# Patient Record
Sex: Female | Born: 2000 | Hispanic: Yes | Marital: Single | Smoking: Never smoker
Health system: Southern US, Community
[De-identification: ages and names within clinical notes are randomized; demographics above are authoritative.]

## PROBLEM LIST (undated history)

## (undated) ENCOUNTER — Emergency Department: Payer: MEDICAID

## (undated) DIAGNOSIS — G43909 Migraine, unspecified, not intractable, without status migrainosus: Secondary | ICD-10-CM

## (undated) DIAGNOSIS — R569 Unspecified convulsions: Secondary | ICD-10-CM

---
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
## 2022-02-27 ENCOUNTER — Other Ambulatory Visit: Payer: Self-pay

## 2022-02-27 ENCOUNTER — Encounter: Payer: Self-pay | Admitting: Emergency Medicine

## 2022-02-27 ENCOUNTER — Emergency Department: Payer: No Typology Code available for payment source

## 2022-02-27 ENCOUNTER — Emergency Department
Admission: EM | Admit: 2022-02-27 | Discharge: 2022-02-27 | Disposition: A | Discharge status: Home / Self Care | Payer: No Typology Code available for payment source | Attending: Emergency Medicine | Admitting: Emergency Medicine

## 2022-02-27 DIAGNOSIS — F419 Anxiety disorder, unspecified: Secondary | ICD-10-CM | POA: Insufficient documentation

## 2022-02-27 DIAGNOSIS — R002 Palpitations: Secondary | ICD-10-CM | POA: Diagnosis not present

## 2022-02-27 DIAGNOSIS — R0602 Shortness of breath: Secondary | ICD-10-CM | POA: Insufficient documentation

## 2022-02-27 DIAGNOSIS — F411 Generalized anxiety disorder: Secondary | ICD-10-CM

## 2022-02-27 DIAGNOSIS — R0789 Other chest pain: Secondary | ICD-10-CM | POA: Diagnosis not present

## 2022-02-27 LAB — CBC WITH DIFFERENTIAL/PLATELET
Abs Immature Granulocytes: 0.03 10*3/uL (ref 0.00–0.07)
Basophils Absolute: 0 10*3/uL (ref 0.0–0.1)
Basophils Relative: 0 %
Eosinophils Absolute: 0 10*3/uL (ref 0.0–0.5)
Eosinophils Relative: 0 %
HCT: 43.3 % (ref 36.0–46.0)
Hemoglobin: 14.5 g/dL (ref 12.0–15.0)
Immature Granulocytes: 0 %
Lymphocytes Relative: 20 %
Lymphs Abs: 2.1 10*3/uL (ref 0.7–4.0)
MCH: 29.5 pg (ref 26.0–34.0)
MCHC: 33.5 g/dL (ref 30.0–36.0)
MCV: 88 fL (ref 80.0–100.0)
Monocytes Absolute: 0.6 10*3/uL (ref 0.1–1.0)
Monocytes Relative: 6 %
Neutro Abs: 7.7 10*3/uL (ref 1.7–7.7)
Neutrophils Relative %: 74 %
Platelets: 287 10*3/uL (ref 150–400)
RBC: 4.92 MIL/uL (ref 3.87–5.11)
RDW: 11.9 % (ref 11.5–15.5)
WBC: 10.4 10*3/uL (ref 4.0–10.5)
nRBC: 0 % (ref 0.0–0.2)

## 2022-02-27 LAB — ETHANOL: Alcohol, Ethyl (B): <10 mg/dL (ref <10)

## 2022-02-27 LAB — URINE DRUG SCREEN, QUALITATIVE (ARMC ONLY)
Amphetamines, Ur Screen: NOT DETECTED
Barbiturates, Ur Screen: NOT DETECTED
Benzodiazepine, Ur Scrn: NOT DETECTED
Cannabinoid 50 Ng, Ur ~~LOC~~: POSITIVE — AB
Cocaine Metabolite,Ur ~~LOC~~: NOT DETECTED
MDMA (Ecstasy)Ur Screen: NOT DETECTED
Methadone Scn, Ur: NOT DETECTED
Opiate, Ur Screen: NOT DETECTED
Phencyclidine (PCP) Ur S: NOT DETECTED
Tricyclic, Ur Screen: NOT DETECTED

## 2022-02-27 LAB — URINALYSIS, ROUTINE W REFLEX MICROSCOPIC
Bilirubin Urine: NEGATIVE
Glucose, UA: NEGATIVE mg/dL
Hgb urine dipstick: NEGATIVE
Ketones, ur: 20 mg/dL — AB
Nitrite: NEGATIVE
Protein, ur: NEGATIVE mg/dL
Specific Gravity, Urine: 1.018 (ref 1.005–1.030)
pH: 8 (ref 5.0–8.0)

## 2022-02-27 LAB — COMPREHENSIVE METABOLIC PANEL
ALT: 19 U/L (ref 0–44)
AST: 24 U/L (ref 15–41)
Albumin: 4.8 g/dL (ref 3.5–5.0)
Alkaline Phosphatase: 68 U/L (ref 38–126)
Anion gap: 8 (ref 5–15)
BUN: 9 mg/dL (ref 6–20)
CO2: 24 mmol/L (ref 22–32)
Calcium: 9.4 mg/dL (ref 8.9–10.3)
Chloride: 106 mmol/L (ref 98–111)
Creatinine, Ser: 0.57 mg/dL (ref 0.44–1.00)
GFR, Estimated: >60 mL/min (ref >60)
Glucose, Bld: 87 mg/dL (ref 70–99)
Potassium: 3.4 mmol/L — ABNORMAL LOW (ref 3.5–5.1)
Sodium: 138 mmol/L (ref 135–145)
Total Bilirubin: 1 mg/dL (ref 0.3–1.2)
Total Protein: 8.6 g/dL — ABNORMAL HIGH (ref 6.5–8.1)

## 2022-02-27 LAB — TSH: TSH: 1.093 u[IU]/mL (ref 0.350–4.500)

## 2022-02-27 LAB — D-DIMER, QUANTITATIVE: D-Dimer, Quant: 0.46 ug/mL-FEU (ref 0.00–0.50)

## 2022-02-27 LAB — LACTIC ACID, PLASMA: Lactic Acid, Venous: 1 mmol/L (ref 0.5–1.9)

## 2022-02-27 LAB — SALICYLATE LEVEL: Salicylate Lvl: <7 mg/dL — ABNORMAL LOW (ref 7.0–30.0)

## 2022-02-27 LAB — ACETAMINOPHEN LEVEL: Acetaminophen (Tylenol), Serum: <10 ug/mL — ABNORMAL LOW (ref 10–30)

## 2022-02-27 LAB — TROPONIN I (HIGH SENSITIVITY): Troponin I (High Sensitivity): <2 ng/L (ref <18)

## 2022-02-27 LAB — PREGNANCY, URINE: Preg Test, Ur: NEGATIVE

## 2022-02-27 MED ORDER — ESCITALOPRAM OXALATE 5 MG PO TABS: 5.0000 mg | ORAL_TABLET | Freq: Every day | ORAL | 0 refills | Status: AC

## 2022-02-27 MED ORDER — ESCITALOPRAM OXALATE 10 MG PO TABS
5.0000 mg | ORAL_TABLET | Freq: Every day | ORAL | Status: DC
  Administered 2022-02-27: 5 mg via ORAL
  Filled 2022-02-27: qty 1

## 2022-02-27 MED ORDER — LORAZEPAM 0.5 MG PO TABS
0.5000 mg | ORAL_TABLET | Freq: Once | ORAL | Status: AC
  Administered 2022-02-27: 0.5 mg via ORAL
  Filled 2022-02-27: qty 1

## 2022-02-27 MED ORDER — HYDROXYZINE HCL 10 MG PO TABS: 10.0000 mg | ORAL_TABLET | Freq: Three times a day (TID) | ORAL | 0 refills | Status: AC | PRN

## 2022-02-27 MED ORDER — HYDROXYZINE HCL 10 MG PO TABS: 10.0000 mg | ORAL_TABLET | Freq: Three times a day (TID) | ORAL | Status: DC | PRN

## 2022-02-27 NOTE — Discharge Instructions (Signed)
Please follow-up with the providers on the list you were given.  Get your prescriptions filled.  You should be out of get them today.  Start them today.  Do not hesitate to return if you feel worse or feels the need to return.  If you ever feel like you want to hurt yourself or anyone else please return immediately.

## 2022-02-27 NOTE — ED Triage Notes (Signed)
Pt to ED via POV, pt states that she has been having increased anxiety over the past few months but that it has been worse the last few days. Pt is not sure if something triggered her anxiety, pt states that she had a cat pass away but other than that nothing major has happened. Pt is tearful in triage.

## 2022-02-27 NOTE — ED Provider Notes (Signed)
[facility] Provider Note    Event Date/Time   First MD Initiated Contact with Patient 02/27/22 1522     (approximate)   History   Anxiety   HPI  Emily Lyons is a 21 y.o. female complains of worsening anxiety.  She says any little thing sets her off.  Her cat died recently and that has made her anxious.  She reports she gets episodes of palpitations with shortness of breath.  She can get nausea with it.  She has had this problem for a number of years but this is getting much worse.  She does not want to go home until we can do something about the anxiety.  She is not coughing.  She is not running a fever.      Physical Exam   Triage Vital Signs: ED Triage Vitals [02/27/22 1434]  Enc Vitals Group     BP (!) 147/97     Pulse Rate (!) 124     Resp 16     Temp 98.6 F (37 C)     Temp Source Oral     SpO2 96 %     Weight      Height      Head Circumference      Peak Flow      Pain Score 0     Pain Loc      Pain Edu?      Excl. in [place]?     Most recent vital signs: Vitals:   02/27/22 1434 02/27/22 1633  BP: (!) 147/97 110/73  Pulse: (!) 124 89  Resp: 16 18  Temp: 98.6 F (37 C) 98.4 F (36.9 C)  SpO2: 96% 100%     General: Awake, looks sad and upset CV:  Good peripheral perfusion.  Heart regular rate and rhythm no audible murmurs but tacky Resp:  Normal effort.  Lungs are clear Abd:  No distention.  Soft nontender no organomegaly palpable Extremities: No edema   ED Results / Procedures / Treatments   Labs (all labs ordered are listed, but only abnormal results are displayed) Labs Reviewed  COMPREHENSIVE METABOLIC PANEL - Abnormal; Notable for the following components:      Result Value   Potassium 3.4 (*)    Total Protein 8.6 (*)    All other components within normal limits  ACETAMINOPHEN LEVEL - Abnormal; Notable for the following components:   Acetaminophen (Tylenol), Serum <10 (*)    All other components within  normal limits  SALICYLATE LEVEL - Abnormal; Notable for the following components:   Salicylate Lvl <7.0 (*)    All other components within normal limits  URINALYSIS, ROUTINE W REFLEX MICROSCOPIC - Abnormal; Notable for the following components:   Color, Urine YELLOW (*)    APPearance HAZY (*)    Ketones, ur 20 (*)    Leukocytes,Ua SMALL (*)    Bacteria, UA RARE (*)    All other components within normal limits  URINE DRUG SCREEN, QUALITATIVE ([facility] ONLY) - Abnormal; Notable for the following components:   Cannabinoid 50 Ng, Ur [place] POSITIVE (*)    All other components within normal limits  ETHANOL  LACTIC ACID, PLASMA  CBC WITH DIFFERENTIAL/PLATELET  PREGNANCY, URINE  D-DIMER, QUANTITATIVE  LACTIC ACID, PLASMA  TSH  TROPONIN I (HIGH SENSITIVITY)  TROPONIN I (HIGH SENSITIVITY)     EKG  EKG read interpreted by me shows normal sinus rhythm rate of 83 rightward axis no acute ST-T wave changes  are seen.  Patient has no other EKGs.   RADIOLOGY Chest x-ray read by radiology reviewed and interpreted by me shows no acute disease  PROCEDURES:  Critical Care performed:   Procedures   MEDICATIONS ORDERED IN ED: Medications  escitalopram (LEXAPRO) tablet 5 mg (has no administration in time range)  hydrOXYzine (ATARAX) tablet 10 mg (has no administration in time range)  LORazepam (ATIVAN) tablet 0.5 mg (0.5 mg Oral Given 02/27/22 1719)     IMPRESSION / MDM / ASSESSMENT AND PLAN / ED COURSE  I reviewed the triage vital signs and the nursing notes. Patient with severe anxiety complains of intermittent chest tightness and shortness of breath.  She was very tachycardic.  EKG now shows her rightward axis with S1Q3T3.  I believe she most likely is just anxious however I will check a D-dimer.  She is young and healthy and her only other risk factor is implanted birth control pill in the arm.  ----------------------------------------- 5:29 PM on  02/27/2022 ----------------------------------------- Patient's labs are okay D-dimer is negative.  Psychiatry is seeing her has given her some Ativan now and she is feeling somewhat better already.  They have called in some prescriptions for her and follow-up as been arranged.  She is cleared by psych I will let her go now.  Patient's presentation is most consistent with acute complicated illness / injury requiring diagnostic workup.  Admission was considered for this patient but it developed and was not needed.     FINAL CLINICAL IMPRESSION(S) / ED DIAGNOSES   Final diagnoses:  Anxiety     Rx / DC Orders   ED Discharge Orders          Ordered    escitalopram (LEXAPRO) 5 MG tablet  Daily        02/27/22 1728    hydrOXYzine (ATARAX) 10 MG tablet  3 times daily PRN        02/27/22 1728             Note:  This document was prepared using Dragon voice recognition software and may include unintentional dictation errors.   Nena Polio, MD 02/27/22 [phone]

## 2022-02-27 NOTE — BH Assessment (Signed)
Comprehensive Clinical Assessment (CCA) Screening, Triage and Referral Note  02/27/2022 Emily Lyons [id number]  Chief Complaint:  Chief Complaint  Patient presents with   Anxiety   Visit Diagnosis: Anxiety Disorder  Emily Lyons is a 21 year old female who presents to the ER due to increase symptoms of anxiety and panic attacks. She states she has dealt with it for majority of her life and learned to cope with it. However, recently she been unable to manage it and affected in her quality of life. She reports of having been prescribe medications in the past but currently isn't taking anything. During the interview, she was cooperative and pleasant and appeared to be anxious. She denies SI/HI and AV/H.  Writer provided the patient with information and instructions on how to access Outpatient Mental Health Treatment.  ______________________________ [facility] [address] [place] [address] [phone]    Patient Reported Information How did you hear about [place]? No data recorded What Is the Reason for Your Visit/Call Today? Patient having increase anxiety and panic attacks.  How Long Has This Been Causing You Problems? <Week  What Do You Feel Would Help You the Most Today? Treatment for Depression or other mood problem   Have You Recently Had Any Thoughts About Hurting Yourself? No  Are You Planning to Commit Suicide/Harm Yourself At This time? No   Have you Recently Had Thoughts About Hurting Someone Emily Lyons? No  Are You Planning to Harm Someone at This Time? No  Explanation: No data recorded  Have You Used Any Alcohol or Drugs in the Past 24 Hours? No  How Long Ago Did You Use Drugs or Alcohol? No data recorded What Did You Use and How Much? No data recorded  Do You Currently Have a Therapist/Psychiatrist? No  Name of Therapist/Psychiatrist: No data recorded  Have You Been Recently Discharged From Any Office Practice or  Programs? No  Explanation of Discharge From Practice/Program: No data recorded   CCA Screening Triage Referral Assessment Type of Contact: Face-to-Face  Telemedicine Service Delivery:   Is this Initial or Reassessment? No data recorded Date Telepsych consult ordered in [facility]:  No data recorded Time Telepsych consult ordered in [facility]:  No data recorded Location of Assessment: [facility] ED  Provider Location: [facility]   Collateral Involvement: No data recorded  Does Patient Have a Court Appointed Legal Guardian? No data recorded Name and Contact of Legal Guardian: No data recorded If Minor and Not Living with Parent(s), Who has Custody? No data recorded Is CPS involved or ever been involved? Never  Is APS involved or ever been involved? Never   Patient Determined To Be At Risk for Harm To Self or Others Based on Review of Patient Reported Information or Presenting Complaint? No  Method: No data recorded Availability of Means: No data recorded Intent: No data recorded Notification Required: No data recorded Additional Information for Danger to Others Potential: No data recorded Additional Comments for Danger to Others Potential: No data recorded Are There Guns or Other Weapons in Your Home? No data recorded Types of Guns/Weapons: No data recorded Are These Weapons Safely Secured?                            No data recorded Who Could Verify You Are Able To Have These Secured: No data recorded Do You Have any Outstanding Charges, Pending Court Dates, Parole/Probation? No data recorded Contacted To Inform of  Risk of Harm To Self or Others: No data recorded  Does Patient Present under Involuntary Commitment? No  IVC Papers Initial File Date: No data recorded  [place] of Residence: [place]   Patient Currently Receiving the Following Services: Not Receiving Services   Determination of Need: Emergent (2 hours)   Options For Referral: ED Visit   Discharge Disposition:    Gunnar Fusi MS, LCAS, [facility], [facility] Therapeutic Triage Specialist 02/27/2022 5:40 PM

## 2022-02-27 NOTE — Consult Note (Signed)
[facility] Face-to-Face Psychiatry Consult   Reason for Consult:  anxiety, panic attacks Referring Physician:  EDP Patient Identification: Emily Lyons MRN:  [id number] Principal Diagnosis: Generalized anxiety disorder Diagnosis:  Principal Problem:   Generalized anxiety disorder   Total Time spent with patient: 45 minutes  Subjective:   Emily Lyons is a 21 y.o. female patient admitted with panic attack.  HPI:  21 yo female presented to the ED with a panic attack. Her anxiety has increased to the point she is not able to perform like she normally does.  She has been having frequent panic attacks, no apparent trigger.  No depression or suicidal/homicidal ideations, hallucinations, or substance abuse.  She is willing to follow up with outpatient services at [facility] in [place].  Discussed medications and will start hydroxyzine 10 mg TID PRN anxiety and 30 mg at bedtime PRN sleep as she had difficulty at times going to sleep related to the anxiety.  Appetite is fair, no weight changes.  Discussed Lexapro and side effects including suicidal ideations starting with the initiation and mania.  Psych cleared with resources to follow up outpatient.  Past Psychiatric History: anxiety  Risk to Self:  none Risk to Others:  none Prior Inpatient Therapy:  none Prior Outpatient Therapy:  none  Past Medical History: History reviewed. No pertinent past medical history. History reviewed. No pertinent surgical history. Family History: No family history on file. Family Psychiatric  History: none Social History:  Social History   Substance and Sexual Activity  Alcohol Use Never     Social History   Substance and Sexual Activity  Drug Use Yes   Types: Marijuana   Comment: week ago    Social History   Socioeconomic History   Marital status: Single    Spouse name: Not on file   Number of children: Not on file   Years of education: Not on file   Highest education level: Not on file   Occupational History   Not on file  Tobacco Use   Smoking status: Never   Smokeless tobacco: Never  Substance and Sexual Activity   Alcohol use: Never   Drug use: Yes    Types: Marijuana    Comment: week ago   Sexual activity: Yes    Birth control/protection: Pill  Other Topics Concern   Not on file  Social History Narrative   Not on file   Social Determinants of Health   Financial Resource Strain: Not on file  Food Insecurity: Not on file  Transportation Needs: Not on file  Physical Activity: Not on file  Stress: Not on file  Social Connections: Not on file   Additional Social History:    Allergies:  No Known Allergies  Labs:  Results for orders placed or performed during the hospital encounter of 02/27/22 (from the past 48 hour(s))  Comprehensive metabolic panel     Status: Abnormal   Collection Time: 02/27/22  3:58 PM  Result Value Ref Range   Sodium 138 135 - 145 mmol/L   Potassium 3.4 (L) 3.5 - 5.1 mmol/L   Chloride 106 98 - 111 mmol/L   CO2 24 22 - 32 mmol/L   Glucose, Bld 87 70 - 99 mg/dL    Comment: Glucose reference range applies only to samples taken after fasting for at least 8 hours.   BUN 9 6 - 20 mg/dL   Creatinine, Ser 0.62 0.44 - 1.00 mg/dL   Calcium 9.4 8.9 - 37.6 mg/dL   Total  Protein 8.6 (H) 6.5 - 8.1 g/dL   Albumin 4.8 3.5 - 5.0 g/dL   AST 24 15 - 41 U/L   ALT 19 0 - 44 U/L   Alkaline Phosphatase 68 38 - 126 U/L   Total Bilirubin 1.0 0.3 - 1.2 mg/dL   GFR, Estimated >60 >60 mL/min    Comment: (NOTE) Calculated using the CKD-EPI Creatinine Equation (2021)    Anion gap 8 5 - 15    Comment: Performed at [facility], [address] [place], [place] [address]  Acetaminophen level     Status: Abnormal   Collection Time: 02/27/22  3:58 PM  Result Value Ref Range   Acetaminophen (Tylenol), Serum <10 (L) 10 - 30 ug/mL    Comment: (NOTE) Therapeutic concentrations vary significantly. A range of 10-30 ug/mL  may be an effective  concentration for many patients. However, some  are best treated at concentrations outside of this range. Acetaminophen concentrations >150 ug/mL at 4 hours after ingestion  and >50 ug/mL at 12 hours after ingestion are often associated with  toxic reactions.  Performed at [facility], [address] [place], [place] [address]   Ethanol     Status: None   Collection Time: 02/27/22  3:58 PM  Result Value Ref Range   Alcohol, Ethyl (B) <10 <10 mg/dL    Comment: (NOTE) Lowest detectable limit for serum alcohol is 10 mg/dL.  For medical purposes only. Performed at [facility], [place], [place] [address]   Troponin I (High Sensitivity)     Status: None   Collection Time: 02/27/22  3:58 PM  Result Value Ref Range   Troponin I (High Sensitivity) <2 <18 ng/L    Comment: (NOTE) Elevated high sensitivity troponin I (hsTnI) values and significant  changes across serial measurements may suggest ACS but many other  chronic and acute conditions are known to elevate hsTnI results.  Refer to the "Links" section for chest pain algorithms and additional  guidance. Performed at [facility], [address] [place], [place] [address]   Salicylate level     Status: Abnormal   Collection Time: 02/27/22  3:58 PM  Result Value Ref Range   Salicylate Lvl <5.3 (L) 7.0 - 30.0 mg/dL    Comment: Performed at [facility], [address] [place], [place] [address]  CBC with Differential     Status: None   Collection Time: 02/27/22  3:58 PM  Result Value Ref Range   WBC 10.4 4.0 - 10.5 K/uL   RBC 4.92 3.87 - 5.11 MIL/uL   Hemoglobin 14.5 12.0 - 15.0 g/dL   HCT 43.3 36.0 - 46.0 %   MCV 88.0 80.0 - 100.0 fL   MCH 29.5 26.0 - 34.0 pg   MCHC 33.5 30.0 - 36.0 g/dL   RDW 11.9 11.5 - 15.5 %   Platelets 287 150 - 400 K/uL   nRBC 0.0 0.0 - 0.2 %   Neutrophils Relative % 74 %   Neutro Abs 7.7 1.7 - 7.7 K/uL   Lymphocytes Relative 20 %   Lymphs  Abs 2.1 0.7 - 4.0 K/uL   Monocytes Relative 6 %   Monocytes Absolute 0.6 0.1 - 1.0 K/uL   Eosinophils Relative 0 %   Eosinophils Absolute 0.0 0.0 - 0.5 K/uL   Basophils Relative 0 %   Basophils Absolute 0.0 0.0 - 0.1 K/uL   Immature Granulocytes 0 %   Abs Immature Granulocytes 0.03 0.00 -  0.07 K/uL    Comment: Performed at [facility], [address] [place], [place] [address]  Pregnancy, urine     Status: None   Collection Time: 02/27/22  3:58 PM  Result Value Ref Range   Preg Test, Ur NEGATIVE NEGATIVE    Comment: Performed at [facility], [address] [place], [place] [address]  Urinalysis, Routine w reflex microscopic     Status: Abnormal   Collection Time: 02/27/22  3:58 PM  Result Value Ref Range   Color, Urine YELLOW (A) YELLOW   APPearance HAZY (A) CLEAR   Specific Gravity, Urine 1.018 1.005 - 1.030   pH 8.0 5.0 - 8.0   Glucose, UA NEGATIVE NEGATIVE mg/dL   Hgb urine dipstick NEGATIVE NEGATIVE   Bilirubin Urine NEGATIVE NEGATIVE   Ketones, ur 20 (A) NEGATIVE mg/dL   Protein, ur NEGATIVE NEGATIVE mg/dL   Nitrite NEGATIVE NEGATIVE   Leukocytes,Ua SMALL (A) NEGATIVE   RBC / HPF 0-5 0 - 5 RBC/hpf   WBC, UA 0-5 0 - 5 WBC/hpf   Bacteria, UA RARE (A) NONE SEEN   Squamous Epithelial / LPF 0-5 0 - 5   Mucus PRESENT     Comment: Performed at [facility], [address] [place], [place] [address]  Urine Drug Screen, Qualitative     Status: Abnormal   Collection Time: 02/27/22  3:58 PM  Result Value Ref Range   Tricyclic, Ur Screen NONE DETECTED NONE DETECTED   Amphetamines, Ur Screen NONE DETECTED NONE DETECTED   MDMA (Ecstasy)Ur Screen NONE DETECTED NONE DETECTED   Cocaine Metabolite,Ur [place] NONE DETECTED NONE DETECTED   Opiate, Ur Screen NONE DETECTED NONE DETECTED   Phencyclidine (PCP) Ur S NONE DETECTED NONE DETECTED   Cannabinoid 50 Ng, Ur  POSITIVE (A) NONE DETECTED   Barbiturates, Ur Screen NONE DETECTED NONE DETECTED    Benzodiazepine, Ur Scrn NONE DETECTED NONE DETECTED   Methadone Scn, Ur NONE DETECTED NONE DETECTED    Comment: (NOTE) Tricyclics + metabolites, urine    Cutoff 1000 ng/mL Amphetamines + metabolites, urine  Cutoff 1000 ng/mL MDMA (Ecstasy), urine              Cutoff 500 ng/mL Cocaine Metabolite, urine          Cutoff 300 ng/mL Opiate + metabolites, urine        Cutoff 300 ng/mL Phencyclidine (PCP), urine         Cutoff 25 ng/mL Cannabinoid, urine                 Cutoff 50 ng/mL Barbiturates + metabolites, urine  Cutoff 200 ng/mL Benzodiazepine, urine              Cutoff 200 ng/mL Methadone, urine                   Cutoff 300 ng/mL  The urine drug screen provides only a preliminary, unconfirmed analytical test result and should not be used for non-medical purposes. Clinical consideration and professional judgment should be applied to any positive drug screen result due to possible interfering substances. A more specific alternate chemical method must be used in order to obtain a confirmed analytical result. Gas chromatography / mass spectrometry (GC/MS) is the preferred confirm atory method. Performed at [facility], [address] [place], [place] [address]   Lactic acid, plasma     Status: None   Collection Time: 02/27/22  4:37 PM  Result Value Ref Range  Lactic Acid, Venous 1.0 0.5 - 1.9 mmol/L    Comment: Performed at [facility], [address] [place], [place] [address]  D-dimer, quantitative     Status: None   Collection Time: 02/27/22  4:37 PM  Result Value Ref Range   D-Dimer, Quant 0.46 0.00 - 0.50 ug/mL-FEU    Comment: (NOTE) At the manufacturer cut-off value of 0.5 g/mL FEU, this assay has a negative predictive value of 95-100%.This assay is intended for use in conjunction with a clinical pretest probability (PTP) assessment model to exclude pulmonary embolism (PE) and deep venous thrombosis (DVT) in outpatients suspected of PE or  DVT. Results should be correlated with clinical presentation. Performed at [facility], [address] [place], [place] [address]     No current facility-administered medications for this encounter.   Current Outpatient Medications  Medication Sig Dispense Refill   cephALEXin (KEFLEX) 500 MG capsule Take 1 capsule (500 mg total) by mouth 3 (three) times daily. (Patient not taking: Reported on 12/26/2018) 30 capsule 0   HYDROcodone-acetaminophen (NORCO) 5-325 MG tablet Take 1 tablet by mouth every 6 (six) hours as needed for up to 7 doses for severe pain. (Patient not taking: Reported on 12/26/2018) 7 tablet 0   naproxen (NAPROSYN) 375 MG tablet Take 1 tablet (375 mg total) by mouth 2 (two) times daily with a meal. (Patient not taking: Reported on 12/26/2018) 10 tablet 0   norethindrone-ethinyl estradiol (LOESTRIN) 1-20 MG-MCG tablet Take 1 tablet by mouth daily.      ondansetron (ZOFRAN ODT) 4 MG disintegrating tablet Take 1 tablet (4 mg total) by mouth every 8 (eight) hours as needed for nausea or vomiting. (Patient not taking: Reported on 12/26/2018) 20 tablet 0   prochlorperazine (COMPAZINE) 10 MG tablet Take 1 tablet (10 mg total) by mouth every 6 (six) hours as needed for nausea or vomiting. (Patient not taking: Reported on 12/26/2018) 12 tablet 0   ranitidine (ZANTAC) 150 MG tablet Take 1 tablet (150 mg total) by mouth 2 (two) times daily. (Patient not taking: Reported on 07/12/2019) 60 tablet 1    Musculoskeletal: Strength & Muscle Tone: within normal limits Gait & Station: normal Patient leans: N/A  Psychiatric Specialty Exam: Physical Exam Vitals and nursing note reviewed.  Constitutional:      Appearance: Normal appearance.  HENT:     Head: Normocephalic.     Nose: Nose normal.  Pulmonary:     Effort: Pulmonary effort is normal.  Musculoskeletal:        General: Normal range of motion.     Cervical back: Normal range of motion.  Neurological:     General: No  focal deficit present.     Mental Status: She is alert and oriented to person, place, and time.  Psychiatric:        Attention and Perception: Attention and perception normal.        Mood and Affect: Mood is anxious.        Speech: Speech normal.        Behavior: Behavior normal. Behavior is cooperative.        Thought Content: Thought content normal.        Cognition and Memory: Cognition and memory normal.        Judgment: Judgment normal.     Review of Systems  Psychiatric/Behavioral:  The patient is nervous/anxious.   All other systems reviewed and are negative.   Blood pressure 121/83, pulse 91, temperature 98.4 F (36.9 C),  temperature source Oral, resp. rate 18, SpO2 100 %.There is no height or weight on file to calculate BMI.  General Appearance: Casual  Eye Contact:  Good  Speech:  Normal Rate  Volume:  Normal  Mood:  Anxious  Affect:  Congruent  Thought Process:  Coherent  Orientation:  Full (Time, Place, and Person)  Thought Content:  WDL and Logical  Suicidal Thoughts:  No  Homicidal Thoughts:  No  Memory:  Immediate;   Good Recent;   Good Remote;   Good  Judgement:  Good  Insight:  Good  Psychomotor Activity:  Normal  Concentration:  Concentration: Good and Attention Span: Good  Recall:  Good  Fund of Knowledge:  Good  Language:  Good  Akathisia:  No  Handed:  Right  AIMS (if indicated):     Assets:  Housing Leisure Time Physical Health Resilience Social Support  ADL's:  Intact  Cognition:  WNL  Sleep:        Physical Exam: Physical Exam Vitals and nursing note reviewed.  Constitutional:      Appearance: Normal appearance.  HENT:     Head: Normocephalic.     Nose: Nose normal.  Pulmonary:     Effort: Pulmonary effort is normal.  Musculoskeletal:        General: Normal range of motion.     Cervical back: Normal range of motion.  Neurological:     General: No focal deficit present.     Mental Status: She is alert and oriented to person,  place, and time.  Psychiatric:        Attention and Perception: Attention and perception normal.        Mood and Affect: Mood is anxious.        Speech: Speech normal.        Behavior: Behavior normal. Behavior is cooperative.        Thought Content: Thought content normal.        Cognition and Memory: Cognition and memory normal.        Judgment: Judgment normal.    Review of Systems  Psychiatric/Behavioral:  The patient is nervous/anxious.   All other systems reviewed and are negative.  Blood pressure 110/73, pulse 89, temperature 98.4 F (36.9 C), temperature source Oral, resp. rate 18, SpO2 100 %. There is no height or weight on file to calculate BMI.  Treatment Plan Summary: General anxiety disorder: Started Lexapro 5 mg daily Started hydroxyzine 10 mg TID PRN and 30 mg at bedtime PRN sleep  Disposition: No evidence of imminent risk to self or others at present.   Patient does not meet criteria for psychiatric inpatient admission. Supportive therapy provided about ongoing stressors.  Nanine Means, NP 02/27/2022 5:25 PM

## 2023-01-05 ENCOUNTER — Emergency Department
Admission: EM | Admit: 2023-01-05 | Discharge: 2023-01-05 | Disposition: A | Discharge status: Home / Self Care | Payer: MEDICAID | Attending: Emergency Medicine | Admitting: Emergency Medicine

## 2023-01-05 ENCOUNTER — Emergency Department: Payer: MEDICAID

## 2023-01-05 ENCOUNTER — Other Ambulatory Visit: Payer: Self-pay

## 2023-01-05 DIAGNOSIS — R569 Unspecified convulsions: Secondary | ICD-10-CM | POA: Insufficient documentation

## 2023-01-05 DIAGNOSIS — R000 Tachycardia, unspecified: Secondary | ICD-10-CM | POA: Diagnosis not present

## 2023-01-05 DIAGNOSIS — R519 Headache, unspecified: Secondary | ICD-10-CM | POA: Insufficient documentation

## 2023-01-05 DIAGNOSIS — H53149 Visual discomfort, unspecified: Secondary | ICD-10-CM | POA: Insufficient documentation

## 2023-01-05 HISTORY — DX: Migraine, unspecified, not intractable, without status migrainosus: G43.909

## 2023-01-05 LAB — BASIC METABOLIC PANEL
Anion gap: 9 (ref 5–15)
BUN: 9 mg/dL (ref 6–20)
CO2: 21 mmol/L — ABNORMAL LOW (ref 22–32)
Calcium: 9.1 mg/dL (ref 8.9–10.3)
Chloride: 106 mmol/L (ref 98–111)
Creatinine, Ser: 0.55 mg/dL (ref 0.44–1.00)
GFR, Estimated: >60 mL/min (ref >60)
Glucose, Bld: 104 mg/dL — ABNORMAL HIGH (ref 70–99)
Potassium: 3.6 mmol/L (ref 3.5–5.1)
Sodium: 136 mmol/L (ref 135–145)

## 2023-01-05 LAB — CBC WITH DIFFERENTIAL/PLATELET
Abs Immature Granulocytes: 0.1 10*3/uL — ABNORMAL HIGH (ref 0.00–0.07)
Basophils Absolute: 0.1 10*3/uL (ref 0.0–0.1)
Basophils Relative: 1 %
Eosinophils Absolute: 0.1 10*3/uL (ref 0.0–0.5)
Eosinophils Relative: 1 %
HCT: 44 % (ref 36.0–46.0)
Hemoglobin: 15.1 g/dL — ABNORMAL HIGH (ref 12.0–15.0)
Immature Granulocytes: 1 %
Lymphocytes Relative: 35 %
Lymphs Abs: 3.9 10*3/uL (ref 0.7–4.0)
MCH: 29.6 pg (ref 26.0–34.0)
MCHC: 34.3 g/dL (ref 30.0–36.0)
MCV: 86.3 fL (ref 80.0–100.0)
Monocytes Absolute: 0.7 10*3/uL (ref 0.1–1.0)
Monocytes Relative: 6 %
Neutro Abs: 6.1 10*3/uL (ref 1.7–7.7)
Neutrophils Relative %: 56 %
Platelets: 330 10*3/uL (ref 150–400)
RBC: 5.1 MIL/uL (ref 3.87–5.11)
RDW: 12.1 % (ref 11.5–15.5)
WBC: 11 10*3/uL — ABNORMAL HIGH (ref 4.0–10.5)
nRBC: 0 % (ref 0.0–0.2)

## 2023-01-05 LAB — URINALYSIS, ROUTINE W REFLEX MICROSCOPIC
Bilirubin Urine: NEGATIVE
Glucose, UA: NEGATIVE mg/dL
Hgb urine dipstick: NEGATIVE
Ketones, ur: NEGATIVE mg/dL
Leukocytes,Ua: NEGATIVE
Nitrite: NEGATIVE
Protein, ur: NEGATIVE mg/dL
Specific Gravity, Urine: 1.015 (ref 1.005–1.030)
pH: 6 (ref 5.0–8.0)

## 2023-01-05 MED ORDER — ACETAMINOPHEN 500 MG PO TABS
1000.0000 mg | ORAL_TABLET | Freq: Once | ORAL | Status: AC
  Administered 2023-01-05: 1000 mg via ORAL
  Filled 2023-01-05: qty 2

## 2023-01-05 MED ORDER — PROCHLORPERAZINE EDISYLATE 10 MG/2ML IJ SOLN
10.0000 mg | Freq: Once | INTRAMUSCULAR | Status: AC
  Administered 2023-01-05: 10 mg via INTRAVENOUS
  Filled 2023-01-05: qty 2

## 2023-01-05 MED ORDER — KETOROLAC TROMETHAMINE 30 MG/ML IJ SOLN
15.0000 mg | Freq: Once | INTRAMUSCULAR | Status: AC
  Administered 2023-01-05: 15 mg via INTRAVENOUS
  Filled 2023-01-05: qty 1

## 2023-01-05 MED ORDER — LACTATED RINGERS IV BOLUS
1000.0000 mL | Freq: Once | INTRAVENOUS | Status: AC
  Administered 2023-01-05: 1000 mL via INTRAVENOUS

## 2023-01-05 NOTE — ED Triage Notes (Signed)
Pt was found in bed by boyfriend shaking this am. Ems said that she was a&o x2 initially but started coming around x4.  History of migraines, head hurts now, eyes are sensitive to light.

## 2023-01-05 NOTE — Discharge Instructions (Addendum)
Please do not drive, go in pools, go up ladders, or otherwise put yourself in a position which would be dangerous for yourself or others if you were to have another seizure. Please reach out to the neurology clinic to be seen ASAP

## 2023-01-05 NOTE — ED Provider Notes (Signed)
[facility] Provider Note    Event Date/Time   First MD Initiated Contact with Patient 01/05/23 [phone]     (approximate)   History   Seizures   HPI  Emily Lyons is a 22 y.o. female who presents to the ED for evaluation of Seizures   Patient history of anxiety and migraines presents to the ED after witnessed seizure-like episode.  She reports she was feeling "normal" when she went to bed last night and woke up with "everyone around me."  Per EMS report, her boyfriend woke to her shaking in the bed, generalized, about 5 minutes before self resolving.  This is never happened before.  She reports headache and photophobia now consistent with previous migraines.   Physical Exam   Triage Vital Signs: ED Triage Vitals [01/05/23 0602]  Encounter Vitals Group     BP      Systolic BP Percentile      Diastolic BP Percentile      Pulse      Resp      Temp      Temp src      SpO2 97 %     Weight      Height      Head Circumference      Peak Flow      Pain Score      Pain Loc      Pain Education      Exclude from Growth Chart     Most recent vital signs: Vitals:   01/05/23 0602 01/05/23 0604  BP:  129/70  Pulse:  (!) 118  Resp:  18  Temp:  98.4 F (36.9 C)  SpO2: 97% 98%    General: Awake, no distress.  CV:  Good peripheral perfusion.  Resp:  Normal effort.  Abd:  No distention.  MSK:  No deformity noted.  Neuro:  No focal deficits appreciated. Cranial nerves II through XII intact 5/5 strength and sensation in all 4 extremities Other:     ED Results / Procedures / Treatments   Labs (all labs ordered are listed, but only abnormal results are displayed) Labs Reviewed  CBC WITH DIFFERENTIAL/PLATELET - Abnormal; Notable for the following components:      Result Value   WBC 11.0 (*)    Hemoglobin 15.1 (*)    Abs Immature Granulocytes 0.10 (*)    All other components within normal limits  BASIC METABOLIC PANEL  URINALYSIS,  ROUTINE W REFLEX MICROSCOPIC  POC URINE PREG, ED    EKG Sinus tachycardia with a rate of 110 bpm.  Normal axis and intervals.  No clear signs of acute ischemia.  RADIOLOGY CT head interpreted by me without evidence of acute intracranial pathology  Official radiology report(s): CT HEAD WO CONTRAST ( )  Result Date: 01/05/2023 CLINICAL DATA:  New onset seizure like activity. Nonfocal exam. History of migraines. EXAM: CT HEAD WITHOUT CONTRAST TECHNIQUE: Contiguous axial images were obtained from the base of the skull through the vertex without intravenous contrast. RADIATION DOSE REDUCTION: This exam was performed according to the departmental dose-optimization program which includes automated exposure control, adjustment of the mA and/or kV according to patient size and/or use of iterative reconstruction technique. COMPARISON:  None Available. FINDINGS: Brain: No evidence of acute infarction, hemorrhage, hydrocephalus, extra-axial collection or mass lesion/mass effect. Vascular: No hyperdense vessel or unexpected calcification. Skull: Normal. Negative for fracture or focal lesion. Sinuses/Orbits: No acute finding. IMPRESSION: Negative head CT. Electronically Signed   By:  Tiburcio Pea M.D.   On: 01/05/2023 06:26    PROCEDURES and INTERVENTIONS:  .1-3 Lead EKG Interpretation  Performed by: Delton Prairie, MD Authorized by: Delton Prairie, MD     Interpretation: abnormal     ECG rate:  108   ECG rate assessment: tachycardic     Rhythm: sinus tachycardia     Ectopy: none     Conduction: normal     Medications  ketorolac (TORADOL) 30 MG/ML injection 15 mg (has no administration in time range)  lactated ringers bolus 1,000 mL (1,000 mLs Intravenous New Bag/Given 01/05/23 0626)  acetaminophen (TYLENOL) tablet 1,000 mg (1,000 mg Oral Given 01/05/23 1610)  prochlorperazine (COMPAZINE) injection 10 mg (10 mg Intravenous Given 01/05/23 9604)     IMPRESSION / MDM / ASSESSMENT AND PLAN / ED COURSE   I reviewed the triage vital signs and the nursing notes.  Differential diagnosis includes, but is not limited to, seizure, pseudoseizure, complex migraine, vasovagal episode, nightmare or night terror  {Patient presents with symptoms of an acute illness or injury that is potentially life-threatening.  Previously healthy 22 year old presents with new onset seizure-like activity.  About 5 minutes of generalized activity witnessed by the boyfriend at home.  Reportedly confused and likely postictal afterwards, clearing by the time she arrived to the ED and looks well here with a reassuring exam without signs of deficits or trauma.  Clear CT head.  CBC with marginal leukocytosis but I doubt infectious etiology of her symptoms such as meningitis or encephalitis.  Awaiting metabolic panel and urine studies around the time of signout to oncoming provider.  As long as she does not have recurrence of seizures or evidence of status epilepticus is suspected will be suitable for outpatient management and neurology follow-up.  Clinical Course as of 01/05/23 0643  Wed Jan 05, 2023  [id number] Reassessed.  Mother at the bedside.  Boyfriend also arrived.  I obtained additional history from the boyfriend about what he saw at home. [DS]    Clinical Course User Index [DS] Delton Prairie, MD     FINAL CLINICAL IMPRESSION(S) / ED DIAGNOSES   Final diagnoses:  Seizure-like activity (HCC)     Rx / DC Orders   ED Discharge Orders     None        Note:  This document was prepared using Dragon voice recognition software and may include unintentional dictation errors.   Delton Prairie, MD 01/05/23 [phone]

## 2023-01-05 NOTE — ED Provider Notes (Signed)
Blood work and UA without concerning findings. Patient without further seizures. States she feels a little jittery, which is likely secondary to compazine. At this time will plan on discharging to follow up with neurology. Discussed seizure precautions.   Phineas Semen, MD 01/05/23 [phone]

## 2023-02-05 ENCOUNTER — Encounter (HOSPITAL_COMMUNITY): Payer: Self-pay | Admitting: Emergency Medicine

## 2023-02-05 ENCOUNTER — Emergency Department (HOSPITAL_COMMUNITY): Payer: MEDICAID

## 2023-02-05 ENCOUNTER — Other Ambulatory Visit: Payer: Self-pay

## 2023-02-05 ENCOUNTER — Emergency Department (HOSPITAL_COMMUNITY)
Admission: EM | Admit: 2023-02-05 | Discharge: 2023-02-06 | Disposition: A | Discharge status: Home / Self Care | Payer: MEDICAID | Attending: Emergency Medicine | Admitting: Emergency Medicine

## 2023-02-05 DIAGNOSIS — R791 Abnormal coagulation profile: Secondary | ICD-10-CM | POA: Insufficient documentation

## 2023-02-05 DIAGNOSIS — S0990XA Unspecified injury of head, initial encounter: Secondary | ICD-10-CM | POA: Diagnosis present

## 2023-02-05 DIAGNOSIS — S299XXA Unspecified injury of thorax, initial encounter: Secondary | ICD-10-CM | POA: Insufficient documentation

## 2023-02-05 DIAGNOSIS — S3991XA Unspecified injury of abdomen, initial encounter: Secondary | ICD-10-CM | POA: Insufficient documentation

## 2023-02-05 DIAGNOSIS — Y9355 Activity, bike riding: Secondary | ICD-10-CM | POA: Insufficient documentation

## 2023-02-05 LAB — URINALYSIS, ROUTINE W REFLEX MICROSCOPIC
Bacteria, UA: NONE SEEN
Bilirubin Urine: NEGATIVE
Glucose, UA: NEGATIVE mg/dL
Ketones, ur: 5 mg/dL — AB
Leukocytes,Ua: NEGATIVE
Nitrite: NEGATIVE
Protein, ur: NEGATIVE mg/dL
Specific Gravity, Urine: 1.006 (ref 1.005–1.030)
pH: 5 (ref 5.0–8.0)

## 2023-02-05 LAB — CBC
HCT: 41 % (ref 36.0–46.0)
Hemoglobin: 13.8 g/dL (ref 12.0–15.0)
MCH: 28.8 pg (ref 26.0–34.0)
MCHC: 33.7 g/dL (ref 30.0–36.0)
MCV: 85.4 fL (ref 80.0–100.0)
Platelets: 258 10*3/uL (ref 150–400)
RBC: 4.8 MIL/uL (ref 3.87–5.11)
RDW: 12 % (ref 11.5–15.5)
WBC: 13.5 10*3/uL — ABNORMAL HIGH (ref 4.0–10.5)
nRBC: 0 % (ref 0.0–0.2)

## 2023-02-05 LAB — COMPREHENSIVE METABOLIC PANEL
ALT: 22 U/L (ref 0–44)
AST: 24 U/L (ref 15–41)
Albumin: 4.5 g/dL (ref 3.5–5.0)
Alkaline Phosphatase: 71 U/L (ref 38–126)
Anion gap: 9 (ref 5–15)
BUN: 5 mg/dL — ABNORMAL LOW (ref 6–20)
CO2: 23 mmol/L (ref 22–32)
Calcium: 9.2 mg/dL (ref 8.9–10.3)
Chloride: 105 mmol/L (ref 98–111)
Creatinine, Ser: 0.73 mg/dL (ref 0.44–1.00)
GFR, Estimated: >60 mL/min (ref >60)
Glucose, Bld: 105 mg/dL — ABNORMAL HIGH (ref 70–99)
Potassium: 3.6 mmol/L (ref 3.5–5.1)
Sodium: 137 mmol/L (ref 135–145)
Total Bilirubin: 0.6 mg/dL (ref 0.3–1.2)
Total Protein: 7.8 g/dL (ref 6.5–8.1)

## 2023-02-05 LAB — I-STAT CHEM 8, ED
BUN: 4 mg/dL — ABNORMAL LOW (ref 6–20)
Calcium, Ion: 1.14 mmol/L — ABNORMAL LOW (ref 1.15–1.40)
Chloride: 104 mmol/L (ref 98–111)
Creatinine, Ser: 0.6 mg/dL (ref 0.44–1.00)
Glucose, Bld: 102 mg/dL — ABNORMAL HIGH (ref 70–99)
HCT: 42 % (ref 36.0–46.0)
Hemoglobin: 14.3 g/dL (ref 12.0–15.0)
Potassium: 3.6 mmol/L (ref 3.5–5.1)
Sodium: 139 mmol/L (ref 135–145)
TCO2: 21 mmol/L — ABNORMAL LOW (ref 22–32)

## 2023-02-05 LAB — ETHANOL: Alcohol, Ethyl (B): <10 mg/dL (ref <10)

## 2023-02-05 LAB — SAMPLE TO BLOOD BANK

## 2023-02-05 LAB — I-STAT CG4 LACTIC ACID, ED: Lactic Acid, Venous: 0.8 mmol/L (ref 0.5–1.9)

## 2023-02-05 LAB — HCG, SERUM, QUALITATIVE: Preg, Serum: NEGATIVE

## 2023-02-05 LAB — PROTIME-INR
INR: 1 (ref 0.8–1.2)
Prothrombin Time: 13.4 s (ref 11.4–15.2)

## 2023-02-05 LAB — POC URINE PREG, ED: Preg Test, Ur: NEGATIVE

## 2023-02-05 MED ORDER — IOHEXOL 350 MG/ML SOLN
75.0000 mL | Freq: Once | INTRAVENOUS | Status: AC | PRN
  Administered 2023-02-05: 75 mL via INTRAVENOUS
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
## 2023-02-05 NOTE — Progress Notes (Signed)
Orthopedic Tech Progress Note Patient Details:  Emily Lyons May 01, 2001 [id number]  Patient ID: Emily Lyons, female   DOB: 11-08-2000, 22 y.o.   MRN: [id number] I attended trauma page. Trinna Post 02/05/2023, 10:44 PM
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
## 2023-02-06 NOTE — ED Notes (Signed)
Patient verbalizes understanding of discharge instructions. Opportunity for questioning and answers were provided. Armband removed by staff, pt discharged from ED. Pt ambulatory to ED waiting room with steady gait.  

## 2023-02-08 ENCOUNTER — Other Ambulatory Visit: Payer: Self-pay | Admitting: Physician Assistant

## 2023-02-08 DIAGNOSIS — R569 Unspecified convulsions: Secondary | ICD-10-CM

## 2023-02-12 ENCOUNTER — Emergency Department
Admission: EM | Admit: 2023-02-12 | Discharge: 2023-02-12 | Disposition: A | Discharge status: Home / Self Care | Payer: MEDICAID | Attending: Emergency Medicine | Admitting: Emergency Medicine

## 2023-02-12 ENCOUNTER — Other Ambulatory Visit: Payer: Self-pay

## 2023-02-12 ENCOUNTER — Emergency Department: Payer: MEDICAID

## 2023-02-12 DIAGNOSIS — R569 Unspecified convulsions: Secondary | ICD-10-CM | POA: Diagnosis present

## 2023-02-12 DIAGNOSIS — X58XXXA Exposure to other specified factors, initial encounter: Secondary | ICD-10-CM | POA: Insufficient documentation

## 2023-02-12 DIAGNOSIS — S00512A Abrasion of oral cavity, initial encounter: Secondary | ICD-10-CM | POA: Diagnosis not present

## 2023-02-12 LAB — CBC
HCT: 46.4 % — ABNORMAL HIGH (ref 36.0–46.0)
Hemoglobin: 15.3 g/dL — ABNORMAL HIGH (ref 12.0–15.0)
MCH: 28.6 pg (ref 26.0–34.0)
MCHC: 33 g/dL (ref 30.0–36.0)
MCV: 86.7 fL (ref 80.0–100.0)
Platelets: 275 10*3/uL (ref 150–400)
RBC: 5.35 MIL/uL — ABNORMAL HIGH (ref 3.87–5.11)
RDW: 12.1 % (ref 11.5–15.5)
WBC: 11.2 10*3/uL — ABNORMAL HIGH (ref 4.0–10.5)
nRBC: 0 % (ref 0.0–0.2)

## 2023-02-12 LAB — BASIC METABOLIC PANEL
Anion gap: 8 (ref 5–15)
BUN: 9 mg/dL (ref 6–20)
CO2: 22 mmol/L (ref 22–32)
Calcium: 9.1 mg/dL (ref 8.9–10.3)
Chloride: 105 mmol/L (ref 98–111)
Creatinine, Ser: 0.57 mg/dL (ref 0.44–1.00)
GFR, Estimated: >60 mL/min (ref >60)
Glucose, Bld: 118 mg/dL — ABNORMAL HIGH (ref 70–99)
Potassium: 3.4 mmol/L — ABNORMAL LOW (ref 3.5–5.1)
Sodium: 135 mmol/L (ref 135–145)

## 2023-02-12 LAB — POC URINE PREG, ED: Preg Test, Ur: NEGATIVE

## 2023-02-12 MED ORDER — ONDANSETRON 4 MG PO TBDP: 4.0000 mg | ORAL_TABLET | Freq: Three times a day (TID) | ORAL | 0 refills | Status: DC | PRN

## 2023-02-12 MED ORDER — VALTOCO 10 MG DOSE 10 MG/0.1ML NA LIQD: 10.0000 mg | Freq: Once | NASAL | 0 refills | Status: AC | PRN

## 2023-02-12 MED ORDER — KETOROLAC TROMETHAMINE 15 MG/ML IJ SOLN
15.0000 mg | Freq: Once | INTRAMUSCULAR | Status: AC
  Administered 2023-02-12: 15 mg via INTRAVENOUS
  Filled 2023-02-12: qty 1

## 2023-02-12 MED ORDER — ONDANSETRON HCL 4 MG/2ML IJ SOLN
4.0000 mg | Freq: Once | INTRAMUSCULAR | Status: AC
  Administered 2023-02-12: 4 mg via INTRAVENOUS
  Filled 2023-02-12: qty 2

## 2023-02-12 MED ORDER — ACETAMINOPHEN 500 MG PO TABS
1000.0000 mg | ORAL_TABLET | Freq: Once | ORAL | Status: AC
  Administered 2023-02-12: 1000 mg via ORAL
  Filled 2023-02-12: qty 2

## 2023-02-12 NOTE — ED Provider Notes (Signed)
[facility] Provider Note    Event Date/Time   First MD Initiated Contact with Patient 02/12/23 1450     (approximate)   History   Chief Complaint: Seizures   HPI  Maily Debarge is a 22 y.o. female with a history of anxiety, recent new onset seizure 2 weeks ago who comes ED complaining of a seizure earlier this morning.  Patient reports being in her usual state of health last night at bedtime.  She does struggle with insomnia and poor sleep.  This morning while asleep, her partner noticed that the patient was having generalized convulsive activity, bit her tongue, urinary incontinence.  Patient complains of generalized headache currently, no fever chills or other acute complaints.  Reviewed neurology clinic note from October 7, she was prescribed Lamictal.  Patient has not started that yet due to concerns about side effects.     Physical Exam   Triage Vital Signs: ED Triage Vitals  Encounter Vitals Group     BP 02/12/23 1217 122/83     Systolic BP Percentile --      Diastolic BP Percentile --      Pulse Rate 02/12/23 1217 99     Resp 02/12/23 1217 17     Temp 02/12/23 1217 98.1 F (36.7 C)     Temp Source 02/12/23 1217 Oral     SpO2 02/12/23 1217 100 %     Weight 02/12/23 1218 160 lb 0.9 oz (72.6 kg)     Height 02/12/23 1218 5\' 3"  (1.6 m)     Head Circumference --      Peak Flow --      Pain Score 02/12/23 1218 8     Pain Loc --      Pain Education --      Exclude from Growth Chart --     Most recent vital signs: Vitals:   02/12/23 1217  BP: 122/83  Pulse: 99  Resp: 17  Temp: 98.1 F (36.7 C)  SpO2: 100%    General: Awake, no distress.  CV:  Good peripheral perfusion.  Regular rate rhythm Resp:  Normal effort.  Abd:  No distention.  Soft nontender Other:  Cranial nerves III through XII intact.  Normal speech and language.  No nystagmus.  Small abrasion to anterior tip of the tongue, not bleeding   ED Results / Procedures /  Treatments   Labs (all labs ordered are listed, but only abnormal results are displayed) Labs Reviewed  BASIC METABOLIC PANEL - Abnormal; Notable for the following components:      Result Value   Potassium 3.4 (*)    Glucose, Bld 118 (*)    All other components within normal limits  CBC - Abnormal; Notable for the following components:   WBC 11.2 (*)    RBC 5.35 (*)    Hemoglobin 15.3 (*)    HCT 46.4 (*)    All other components within normal limits  CBG MONITORING, ED  POC URINE PREG, ED     EKG Interpreted by me Normal sinus rhythm rate of 99.  Right axis, normal intervals.  Normal QRS ST segments and T waves   RADIOLOGY CT head interpreted by me, no intracranial hemorrhage.  Radiology report reviewed   PROCEDURES:  Procedures   MEDICATIONS ORDERED IN ED: Medications  ketorolac (TORADOL) 15 MG/ML injection 15 mg (has no administration in time range)  acetaminophen (TYLENOL) tablet 1,000 mg (has no administration in time range)  ondansetron (ZOFRAN)  injection 4 mg (has no administration in time range)     IMPRESSION / MDM / ASSESSMENT AND PLAN / ED COURSE  I reviewed the triage vital signs and the nursing notes.  DDx: Electrolyte abnormality, pregnancy, intracranial hemorrhage, seizure  Patient's presentation is most consistent with acute presentation with potential threat to life or bodily function.  Patient presents with likely seizure last night.  Possibly aggravated by chronically poor sleep.  Vital signs exam and labs here in the ED are unremarkable.  Encouraged her to start the Lamictal as prescribed by neurology and continue following up.  Prescribed the emergency intranasal Valium and Zofran as well.       FINAL CLINICAL IMPRESSION(S) / ED DIAGNOSES   Final diagnoses:  Seizure (HCC)     Rx / DC Orders   ED Discharge Orders          Ordered    ondansetron (ZOFRAN-ODT) 4 MG disintegrating tablet  Every 8 hours PRN        02/12/23 1505     diazePAM (VALTOCO 10 MG DOSE) 10 MG/0.1ML LIQD  Once PRN        02/12/23 1505             Note:  This document was prepared using Dragon voice recognition software and may include unintentional dictation errors.   Sharman Cheek, MD 02/12/23 [phone]

## 2023-02-12 NOTE — ED Triage Notes (Signed)
Pt here with seizures. Pt states she had her 1st one a few weeks ago. Pt has not been started on medication. Pt states she was sleeping today and then had the seizure. Pt states she does not feel well. Pt states her head is hurting and she feels weak.

## 2023-04-29 ENCOUNTER — Other Ambulatory Visit: Payer: Self-pay

## 2023-04-29 ENCOUNTER — Emergency Department
Admission: EM | Admit: 2023-04-29 | Discharge: 2023-04-29 | Disposition: A | Discharge status: Home / Self Care | Payer: MEDICAID | Attending: Emergency Medicine | Admitting: Emergency Medicine

## 2023-04-29 DIAGNOSIS — N939 Abnormal uterine and vaginal bleeding, unspecified: Secondary | ICD-10-CM | POA: Diagnosis present

## 2023-04-29 HISTORY — DX: Unspecified convulsions: R56.9

## 2023-04-29 LAB — CBC
HCT: 40.5 % (ref 36.0–46.0)
Hemoglobin: 13.8 g/dL (ref 12.0–15.0)
MCH: 29 pg (ref 26.0–34.0)
MCHC: 34.1 g/dL (ref 30.0–36.0)
MCV: 85.1 fL (ref 80.0–100.0)
Platelets: 260 10*3/uL (ref 150–400)
RBC: 4.76 MIL/uL (ref 3.87–5.11)
RDW: 12 % (ref 11.5–15.5)
WBC: 7.1 10*3/uL (ref 4.0–10.5)
nRBC: 0 % (ref 0.0–0.2)

## 2023-04-29 LAB — POC URINE PREG, ED: Preg Test, Ur: NEGATIVE

## 2023-04-29 LAB — HCG, QUANTITATIVE, PREGNANCY: hCG, Beta Chain, Quant, S: <1 m[IU]/mL (ref <5)

## 2023-04-29 MED ORDER — MEDROXYPROGESTERONE ACETATE 5 MG PO TABS
5.0000 mg | ORAL_TABLET | Freq: Every day | ORAL | 0 refills | Status: AC
Start: 2023-04-29 — End: 2024-04-28

## 2023-04-29 NOTE — ED Triage Notes (Signed)
Pt c/o vaginal bleeding for the past 5 days. Pt states she is going through a 10hr pad in two hours. Passing clots as well.

## 2023-04-29 NOTE — ED Provider Notes (Signed)
[facility] Provider Note    Event Date/Time   First MD Initiated Contact with Patient 04/29/23 1343     (approximate)   History   Vaginal Bleeding   HPI  Teslyn Delahoz is a 22 y.o. female with history of migraines and seizures presents emergency department with heavy vaginal bleeding for the past 5 days.  Patient states she is going through an overnight pad about every 2 hours.  Also passing some clots.  States she has Nexplanon.  She is on her third year with this medication.  Recently started on a new seizure medication.  Does have a GYN at [place] [facility].  Denies fever or chills.      Physical Exam   Triage Vital Signs: ED Triage Vitals  Encounter Vitals Group     BP 04/29/23 1003 (!) 125/94     Systolic BP Percentile --      Diastolic BP Percentile --      Pulse Rate 04/29/23 1003 90     Resp 04/29/23 1003 18     Temp 04/29/23 1003 98.4 F (36.9 C)     Temp Source 04/29/23 1003 Oral     SpO2 04/29/23 1003 97 %     Weight --      Height --      Head Circumference --      Peak Flow --      Pain Score 04/29/23 1004 2     Pain Loc --      Pain Education --      Exclude from Growth Chart --     Most recent vital signs: Vitals:   04/29/23 1003 04/29/23 1333  BP: (!) 125/94 128/86  Pulse: 90 77  Resp: 18 16  Temp: 98.4 F (36.9 C) 98.2 F (36.8 C)  SpO2: 97% 100%     General: Awake, no distress.   CV:  Good peripheral perfusion. regular rate and  rhythm Resp:  Normal effort.  Abd:  No distention.  Nontender Other:      ED Results / Procedures / Treatments   Labs (all labs ordered are listed, but only abnormal results are displayed) Labs Reviewed  CBC  HCG, QUANTITATIVE, PREGNANCY  POC URINE PREG, ED     EKG     RADIOLOGY     PROCEDURES:   Procedures   MEDICATIONS ORDERED IN ED: Medications - No data to display   IMPRESSION / MDM / ASSESSMENT AND PLAN / ED COURSE  I reviewed the triage vital  signs and the nursing notes.                              Differential diagnosis includes, but is not limited to, dysfunctional uterine bleeding, abnormal uterine bleeding, menorrhagia, hypothyroidism  Patient's presentation is most consistent with acute illness / injury with system symptoms.   Patient's labs are reassuring, beta-hCG is less than 1 so do not feel this is a miscarriage of any sort, blood cell counts are stable with hemoglobin at 13.8  I did explain these findings to the patient.  She appears to be very stable.  Did offer her Provera to help stop the bleeding, did caution her that she will have rebound bleeding and should follow-up with her doctor.  She is in agreement treatment plan.  Discharged stable condition.      FINAL CLINICAL IMPRESSION(S) / ED DIAGNOSES   Final diagnoses:  Vaginal  bleeding     Rx / DC Orders   ED Discharge Orders          Ordered    medroxyPROGESTERone (PROVERA) 5 MG tablet  Daily        04/29/23 1349             Note:  This document was prepared using Dragon voice recognition software and may include unintentional dictation errors.    Faythe Ghee, PA-C 04/29/23 1401    Corena Herter, MD 04/29/23 [phone]

## 2023-05-04 ENCOUNTER — Other Ambulatory Visit: Payer: Self-pay

## 2023-05-04 ENCOUNTER — Emergency Department
Admission: EM | Admit: 2023-05-04 | Discharge: 2023-05-04 | Discharge status: Against Medical Advice | Payer: MEDICAID | Attending: Emergency Medicine | Admitting: Emergency Medicine

## 2023-05-04 ENCOUNTER — Emergency Department: Payer: MEDICAID

## 2023-05-04 DIAGNOSIS — R519 Headache, unspecified: Secondary | ICD-10-CM | POA: Diagnosis not present

## 2023-05-04 DIAGNOSIS — R6884 Jaw pain: Secondary | ICD-10-CM | POA: Insufficient documentation

## 2023-05-04 DIAGNOSIS — R569 Unspecified convulsions: Secondary | ICD-10-CM | POA: Diagnosis present

## 2023-05-04 DIAGNOSIS — Z5321 Procedure and treatment not carried out due to patient leaving prior to being seen by health care provider: Secondary | ICD-10-CM | POA: Insufficient documentation

## 2023-05-04 LAB — CBC
HCT: 39.7 % (ref 36.0–46.0)
Hemoglobin: 13.3 g/dL (ref 12.0–15.0)
MCH: 29 pg (ref 26.0–34.0)
MCHC: 33.5 g/dL (ref 30.0–36.0)
MCV: 86.5 fL (ref 80.0–100.0)
Platelets: 272 10*3/uL (ref 150–400)
RBC: 4.59 MIL/uL (ref 3.87–5.11)
RDW: 12.4 % (ref 11.5–15.5)
WBC: 11.6 10*3/uL — ABNORMAL HIGH (ref 4.0–10.5)
nRBC: 0 % (ref 0.0–0.2)

## 2023-05-04 LAB — BASIC METABOLIC PANEL
Anion gap: 9 (ref 5–15)
BUN: 7 mg/dL (ref 6–20)
CO2: 22 mmol/L (ref 22–32)
Calcium: 9.1 mg/dL (ref 8.9–10.3)
Chloride: 105 mmol/L (ref 98–111)
Creatinine, Ser: 0.58 mg/dL (ref 0.44–1.00)
GFR, Estimated: >60 mL/min (ref >60)
Glucose, Bld: 97 mg/dL (ref 70–99)
Potassium: 3.7 mmol/L (ref 3.5–5.1)
Sodium: 136 mmol/L (ref 135–145)

## 2023-05-04 NOTE — ED Triage Notes (Signed)
 Pt comes with c/o seizure. Pt states it last for awhile. Pt states she does take meds but did forget to take it last night. Pt answering all questions appropriately.   Pt states she was laying down when this happened. Pt states headache and feels like it is going to explode. Pt takes lamotrigine  25mg   Pt states it was witnessed and last for like 10 minutes. Pt states pain to left side of jaw.

## 2023-06-01 ENCOUNTER — Other Ambulatory Visit: Payer: Self-pay | Admitting: Physician Assistant

## 2023-06-01 DIAGNOSIS — R569 Unspecified convulsions: Secondary | ICD-10-CM

## 2023-06-02 ENCOUNTER — Ambulatory Visit
Admission: RE | Admit: 2023-06-02 | Discharge: 2023-06-02 | Disposition: A | Discharge status: Home / Self Care | Payer: MEDICAID | Source: Ambulatory Visit | Attending: Physician Assistant | Admitting: Physician Assistant

## 2023-06-02 DIAGNOSIS — R569 Unspecified convulsions: Secondary | ICD-10-CM | POA: Insufficient documentation

## 2023-06-02 MED ORDER — GADOBUTROL 1 MMOL/ML IV SOLN
7.0000 mL | Freq: Once | INTRAVENOUS | Status: AC | PRN
  Administered 2023-06-02: 7 mL via INTRAVENOUS

## 2023-09-24 ENCOUNTER — Emergency Department: Payer: MEDICAID

## 2023-09-24 ENCOUNTER — Emergency Department
Admission: EM | Admit: 2023-09-24 | Discharge: 2023-09-24 | Disposition: A | Discharge status: Home / Self Care | Payer: MEDICAID | Attending: Emergency Medicine | Admitting: Emergency Medicine

## 2023-09-24 DIAGNOSIS — S43005A Unspecified dislocation of left shoulder joint, initial encounter: Secondary | ICD-10-CM | POA: Insufficient documentation

## 2023-09-24 DIAGNOSIS — S4992XA Unspecified injury of left shoulder and upper arm, initial encounter: Secondary | ICD-10-CM | POA: Diagnosis present

## 2023-09-24 DIAGNOSIS — R569 Unspecified convulsions: Secondary | ICD-10-CM | POA: Insufficient documentation

## 2023-09-24 DIAGNOSIS — S42135A Nondisplaced fracture of coracoid process, left shoulder, initial encounter for closed fracture: Secondary | ICD-10-CM | POA: Diagnosis not present

## 2023-09-24 DIAGNOSIS — W1839XA Other fall on same level, initial encounter: Secondary | ICD-10-CM | POA: Insufficient documentation

## 2023-09-24 LAB — COMPREHENSIVE METABOLIC PANEL WITH GFR
ALT: 15 U/L (ref 0–44)
AST: 20 U/L (ref 15–41)
Albumin: 4.8 g/dL (ref 3.5–5.0)
Alkaline Phosphatase: 71 U/L (ref 38–126)
Anion gap: 9 (ref 5–15)
BUN: 9 mg/dL (ref 6–20)
CO2: 21 mmol/L — ABNORMAL LOW (ref 22–32)
Calcium: 9.3 mg/dL (ref 8.9–10.3)
Chloride: 106 mmol/L (ref 98–111)
Creatinine, Ser: 0.6 mg/dL (ref 0.44–1.00)
GFR, Estimated: >60 mL/min (ref >60)
Glucose, Bld: 121 mg/dL — ABNORMAL HIGH (ref 70–99)
Potassium: 3.6 mmol/L (ref 3.5–5.1)
Sodium: 136 mmol/L (ref 135–145)
Total Bilirubin: 0.8 mg/dL (ref 0.0–1.2)
Total Protein: 7.6 g/dL (ref 6.5–8.1)

## 2023-09-24 LAB — CBC WITH DIFFERENTIAL/PLATELET
Abs Immature Granulocytes: 0.08 10*3/uL — ABNORMAL HIGH (ref 0.00–0.07)
Basophils Absolute: 0.1 10*3/uL (ref 0.0–0.1)
Basophils Relative: 1 %
Eosinophils Absolute: 0 10*3/uL (ref 0.0–0.5)
Eosinophils Relative: 0 %
HCT: 40 % (ref 36.0–46.0)
Hemoglobin: 13.7 g/dL (ref 12.0–15.0)
Immature Granulocytes: 1 %
Lymphocytes Relative: 16 %
Lymphs Abs: 2 10*3/uL (ref 0.7–4.0)
MCH: 29 pg (ref 26.0–34.0)
MCHC: 34.3 g/dL (ref 30.0–36.0)
MCV: 84.7 fL (ref 80.0–100.0)
Monocytes Absolute: 0.7 10*3/uL (ref 0.1–1.0)
Monocytes Relative: 6 %
Neutro Abs: 9.7 10*3/uL — ABNORMAL HIGH (ref 1.7–7.7)
Neutrophils Relative %: 76 %
Platelets: 285 10*3/uL (ref 150–400)
RBC: 4.72 MIL/uL (ref 3.87–5.11)
RDW: 12.6 % (ref 11.5–15.5)
WBC: 12.6 10*3/uL — ABNORMAL HIGH (ref 4.0–10.5)
nRBC: 0 % (ref 0.0–0.2)

## 2023-09-24 LAB — HCG, QUANTITATIVE, PREGNANCY: hCG, Beta Chain, Quant, S: 1 m[IU]/mL (ref <5)

## 2023-09-24 MED ORDER — LAMOTRIGINE 100 MG PO TABS
100.0000 mg | ORAL_TABLET | Freq: Once | ORAL | Status: DC
  Filled 2023-09-24: qty 1

## 2023-09-24 MED ORDER — LACTATED RINGERS IV BOLUS
1000.0000 mL | Freq: Once | INTRAVENOUS | Status: AC
  Administered 2023-09-24: 1000 mL via INTRAVENOUS

## 2023-09-24 MED ORDER — FENTANYL CITRATE (PF) 100 MCG/2ML IJ SOLN
INTRAMUSCULAR | Status: AC | PRN
  Administered 2023-09-24: 25 ug via INTRAVENOUS

## 2023-09-24 MED ORDER — PROPOFOL 10 MG/ML IV BOLUS
200.0000 mg | Freq: Once | INTRAVENOUS | Status: DC
  Filled 2023-09-24: qty 20

## 2023-09-24 MED ORDER — FENTANYL CITRATE PF 50 MCG/ML IJ SOSY
25.0000 ug | PREFILLED_SYRINGE | Freq: Once | INTRAMUSCULAR | Status: DC | PRN
  Filled 2023-09-24: qty 1

## 2023-09-24 MED ORDER — MORPHINE SULFATE (PF) 4 MG/ML IV SOLN
4.0000 mg | Freq: Once | INTRAVENOUS | Status: AC
  Administered 2023-09-24: 4 mg via INTRAVENOUS
  Filled 2023-09-24: qty 1

## 2023-09-24 MED ORDER — PROPOFOL 10 MG/ML IV BOLUS
INTRAVENOUS | Status: AC | PRN
  Administered 2023-09-24: 50 mg via INTRAVENOUS

## 2023-09-24 MED ORDER — OXYCODONE HCL 5 MG PO TABS: 5.0000 mg | ORAL_TABLET | Freq: Three times a day (TID) | ORAL | 0 refills | Status: AC | PRN

## 2023-09-24 NOTE — Sedation Documentation (Signed)
 Pt remains awake and able to participate in positioning for X-rays at this time. NAD noted.

## 2023-09-24 NOTE — Sedation Documentation (Signed)
 Medication dose calculated and verified for: 200mg  propofol.

## 2023-09-24 NOTE — ED Provider Notes (Signed)
 [facility] Provider Note    Event Date/Time   First MD Initiated Contact with Patient 09/24/23 1950     (approximate)   History   Seizures and Shoulder Injury   HPI  Emily Lyons is a 23 y.o. female with history of seizure, migraines, generalized anxiety disorder, presenting with seizure as well as left shoulder pain.  Per boyfriend, patient had a seizure, fell to her left shoulder, seizure lasted for 2 minutes, she did have some tongue biting.  Patient states that she missed her lamotrigine today.  Per independent history from EMS, they were called for seizure, patient was not actively seizing when they got there.  Was complaining about shoulder pain, they are concerned about dislocation, put her in a sling.  Patient denies any new weakness or numbness.  States that she was having headaches earlier, no infectious symptoms, no neck pain.  Independent history obtained from boyfriend and EMS as above.   On independent chart review, she was seen by neurology in March for seizure, was continued on her Lamictal as well as Valtoco  as needed.  Physical Exam   Triage Vital Signs: ED Triage Vitals  Encounter Vitals Group     BP 09/24/23 1950 110/84     Systolic BP Percentile --      Diastolic BP Percentile --      Pulse Rate 09/24/23 1945 93     Resp 09/24/23 1945 18     Temp 09/24/23 1945 98.3 F (36.8 C)     Temp Source 09/24/23 1945 Oral     SpO2 09/24/23 1945 99 %     Weight 09/24/23 1948 147 lb (66.7 kg)     Height 09/24/23 1948 5\' 3"  (1.6 m)     Head Circumference --      Peak Flow --      Pain Score 09/24/23 1948 8     Pain Loc --      Pain Education --      Exclude from Growth Chart --     Most recent vital signs: Vitals:   09/24/23 2230 09/24/23 2245  BP: 120/84   Pulse: 87 78  Resp: 15 17  Temp:    SpO2: 100% 100%     General: Awake, no distress.  CV:  Good peripheral perfusion.  Resp:  Normal effort.  No thoracic cage  tenderness Abd:  No distention.  Soft nontender Other:  Pupils are equal and reactive, extraocular movements are intact, no cranial nerve deficits, no palpable skull deformities or tenderness, no midline spinal tenderness, she has no tenderness to her right upper extremity or her bilateral lower extremities, radial pulses are intact bilaterally, she has no focal weakness or numbness, grip strength is intact to her left upper extremity, she has tenderness to her left shoulder with palpable empty glenoid, no tenderness to the rest of her humerus, elbow, forearm, wrist, hand.   ED Results / Procedures / Treatments   Labs (all labs ordered are listed, but only abnormal results are displayed) Labs Reviewed  COMPREHENSIVE METABOLIC PANEL WITH GFR - Abnormal; Notable for the following components:      Result Value   CO2 21 (*)    Glucose, Bld 121 (*)    All other components within normal limits  CBC WITH DIFFERENTIAL/PLATELET - Abnormal; Notable for the following components:   WBC 12.6 (*)    Neutro Abs 9.7 (*)    Abs Immature Granulocytes 0.08 (*)  All other components within normal limits  HCG, QUANTITATIVE, PREGNANCY     EKG  EKG shows, sinus rhythm, rate 81, normal QS, normal QTc, no ischemic ST elevation, no obvious T wave changes, not sig changed compared to prior   RADIOLOGY On my independent interpretation, x-ray of the shoulder shows a left anterior dislocation   PROCEDURES:  Critical Care performed: No  .Reduction of dislocation  Date/Time: 09/24/2023 10:44 PM  Performed by: Shane Darling, MD Authorized by: Shane Darling, MD  Consent: Verbal consent obtained. Written consent obtained. Consent given by: patient Patient identity confirmed: verbally with patient and arm band Time out: Immediately prior to procedure a "time out" was called to verify the correct patient, procedure, equipment, support staff and site/side marked as required. Local anesthesia used:  no  Anesthesia: Local anesthesia used: no  Sedation: Patient sedated: yes Sedation type: moderate (conscious) sedation Sedatives: propofol Analgesia: fentanyl  Sedation start date/time: 09/24/2023 9:42 PM Sedation end date/time: 09/24/2023 9:47 PM Vitals: Vital signs were monitored during sedation.  Patient tolerance: patient tolerated the procedure well with no immediate complications      MEDICATIONS ORDERED IN ED: Medications  lamoTRIgine (LAMICTAL) tablet 100 mg (100 mg Oral Not Given 09/24/23 2026)  propofol (DIPRIVAN) 10 mg/mL bolus/IV push 200 mg (50 mg Intravenous See Procedure Record 09/24/23 2148)  fentaNYL  (SUBLIMAZE ) injection 25 mcg (25 mcg Intravenous See Procedure Record 09/24/23 2149)  morphine  (PF) 4 MG/ML injection 4 mg (4 mg Intravenous Given 09/24/23 2023)  lactated ringers  bolus 1,000 mL (0 mLs Intravenous Stopped 09/24/23 2246)  propofol (DIPRIVAN) 10 mg/mL bolus/IV push (50 mg Intravenous Given 09/24/23 2144)  fentaNYL  (SUBLIMAZE ) injection (25 mcg Intravenous Given 09/24/23 2137)     IMPRESSION / MDM / ASSESSMENT AND PLAN / ED COURSE  I reviewed the triage vital signs and the nursing notes.                              Differential diagnosis includes, but is not limited to, breakthrough seizure, medication noncompliance, shoulder dislocation, fracture, contusion, strain.  For headache considered migraine, tension headache, headache is not severe, not was present for life, she has no focal weakness or numbness, doubt subarachnoid or stroke.  Will get labs, x-ray of the shoulder, CT head given that she did fall to the ground and hit her head.  Will give her some IV morphine  here as well as her Lamictal dose.  Patient's presentation is most consistent with acute presentation with potential threat to life or bodily function.  Independent interpretation of labs and imaging below.  X-ray showed nondisplaced coracoid fracture, also showed a left anterior shoulder  dislocation.  Discussed imaging and lab results with patient and family, discussed reduction as well as procedural sedation including risk and benefits, she is agreeable with the plan to proceed.  Patient was sedated with propofol and fentanyl , successful reduction was done.  X-ray shows that the shoulder is back in place.  Will give her number to call for orthopedic surgery to follow-up, she was placed in a sling.  On reassessment patient is back to her mental baseline.  Will give her a short prescription for oxycodone for severe breakthrough pain, instructed her to take Tylenol  or ibuprofen as needed for pain.  Also instructed her to take her seizure medications as prescribed and to follow-up with a neurologist for further management of her seizures.  Otherwise considered but no indication for inpatient admission  at this time, she safe for outpatient management.  Will discharge as directed precautions.  The patient is on the cardiac monitor to evaluate for evidence of arrhythmia and/or significant heart rate changes.   Clinical Course as of 09/24/23 2247  Sat Sep 24, 2023  2033 CT Head Wo Contrast No acute intracranial abnormality.  [TT]  2044 DG Shoulder Left IMPRESSION: 1. Anterior left shoulder dislocation. 2. Question acute nondisplaced coracoid fracture.   [TT]  2122 HCG, Beta Chain, Quant, S: 1 [TT]  2241 DG Shoulder Left IMPRESSION: 1. Reduction of prior left shoulder dislocation, with anatomic alignment of the glenohumeral joint. 2. No acute displaced fracture. The possible coracoid fracture seen on prior exam is not well visualized on this study.   [TT]    Clinical Course User Index [TT] Shane Darling, MD     FINAL CLINICAL IMPRESSION(S) / ED DIAGNOSES   Final diagnoses:  Seizure (HCC)  Dislocation of left shoulder joint, initial encounter  Closed nondisplaced fracture of coracoid process of left shoulder, initial encounter     Rx / DC Orders   ED Discharge Orders           Ordered    oxyCODONE (ROXICODONE) 5 MG immediate release tablet  Every 8 hours PRN        09/24/23 2244             Note:  This document was prepared using Dragon voice recognition software and may include unintentional dictation errors.    Shane Darling, MD 09/24/23 [phone]

## 2023-09-24 NOTE — ED Notes (Signed)
 Patient left for CT at this time.

## 2023-09-24 NOTE — ED Notes (Signed)
 Called charge nurse to let her know that this patient is going to need consciously sedated.

## 2023-09-24 NOTE — ED Triage Notes (Signed)
 Patients boyfriend called EMS sue to patient having a seizure and falling on her left shoulder. Patient has a hx of seizures and takes Lamotrigine 100mg  BID. Patient states that she missed her morning dose. Seizure time unknown.

## 2023-09-24 NOTE — Sedation Documentation (Signed)
 Xray at bedside for post reduction films. Pt arouses on calling. L shoulder sling place by Destiny, RN and Dr. Drenda Gentle prior to Dr. Drenda Gentle departure from room. VSS at this time. Pt's family remains outside of room at this time, updated by EDP.

## 2023-09-24 NOTE — Sedation Documentation (Signed)
 Pt remains alert and interactive with staff after x-rays, VSS, pt's family at bedside. Pt remains on cardiac monitor at this time, NAD noted at this time. Pt denies any needs. Pt's primary RN aware of patient condition at time of sedation end.

## 2023-09-24 NOTE — Discharge Instructions (Addendum)
 Please make sure to take the medication as prescribed for your seizure.  Please let your neurologist know that you are here.  For your coracoid fracture as well as your shoulder dislocation, I have left the number for orthopedic surgery for you to call to follow-up.  You can take 40 mg of ibuprofen or 650 mg of Tylenol  every 6 hours as needed for pain.  Please reserve the oxycodone for severe breakthrough pain.  Since you had a recent seizure, please do not drive until you are cleared by your neurologist, when you are on the oxycodone please.  do not operate any heavy machinery.

## 2024-04-20 ENCOUNTER — Emergency Department: Payer: MEDICAID

## 2024-04-20 ENCOUNTER — Other Ambulatory Visit: Payer: Self-pay

## 2024-04-20 ENCOUNTER — Inpatient Hospital Stay
Admission: EM | Admit: 2024-04-20 | Discharge: 2024-04-21 | DRG: 100 | Discharge status: Against Medical Advice | Payer: MEDICAID | Attending: Internal Medicine | Admitting: Internal Medicine

## 2024-04-20 DIAGNOSIS — W1830XA Fall on same level, unspecified, initial encounter: Secondary | ICD-10-CM | POA: Diagnosis present

## 2024-04-20 DIAGNOSIS — G43909 Migraine, unspecified, not intractable, without status migrainosus: Secondary | ICD-10-CM | POA: Diagnosis present

## 2024-04-20 DIAGNOSIS — F1210 Cannabis abuse, uncomplicated: Secondary | ICD-10-CM | POA: Diagnosis present

## 2024-04-20 DIAGNOSIS — G40919 Epilepsy, unspecified, intractable, without status epilepticus: Secondary | ICD-10-CM

## 2024-04-20 DIAGNOSIS — K226 Gastro-esophageal laceration-hemorrhage syndrome: Secondary | ICD-10-CM | POA: Diagnosis present

## 2024-04-20 DIAGNOSIS — N83292 Other ovarian cyst, left side: Secondary | ICD-10-CM | POA: Diagnosis present

## 2024-04-20 DIAGNOSIS — Z79899 Other long term (current) drug therapy: Secondary | ICD-10-CM

## 2024-04-20 DIAGNOSIS — R112 Nausea with vomiting, unspecified: Secondary | ICD-10-CM | POA: Diagnosis present

## 2024-04-20 DIAGNOSIS — Z793 Long term (current) use of hormonal contraceptives: Secondary | ICD-10-CM

## 2024-04-20 DIAGNOSIS — N83299 Other ovarian cyst, unspecified side: Secondary | ICD-10-CM | POA: Insufficient documentation

## 2024-04-20 DIAGNOSIS — F419 Anxiety disorder, unspecified: Secondary | ICD-10-CM | POA: Diagnosis present

## 2024-04-20 DIAGNOSIS — R569 Unspecified convulsions: Principal | ICD-10-CM

## 2024-04-20 DIAGNOSIS — G40909 Epilepsy, unspecified, not intractable, without status epilepticus: Principal | ICD-10-CM | POA: Diagnosis present

## 2024-04-20 DIAGNOSIS — Z1152 Encounter for screening for COVID-19: Secondary | ICD-10-CM

## 2024-04-20 DIAGNOSIS — N83202 Unspecified ovarian cyst, left side: Secondary | ICD-10-CM

## 2024-04-20 DIAGNOSIS — R1110 Vomiting, unspecified: Secondary | ICD-10-CM | POA: Diagnosis present

## 2024-04-20 LAB — COMPREHENSIVE METABOLIC PANEL WITH GFR
ALT: 14 U/L (ref 0–44)
AST: 22 U/L (ref 15–41)
Albumin: 4.8 g/dL (ref 3.5–5.0)
Alkaline Phosphatase: 81 U/L (ref 38–126)
Anion gap: 12 (ref 5–15)
BUN: 8 mg/dL (ref 6–20)
CO2: 22 mmol/L (ref 22–32)
Calcium: 9.3 mg/dL (ref 8.9–10.3)
Chloride: 105 mmol/L (ref 98–111)
Creatinine, Ser: 0.64 mg/dL (ref 0.44–1.00)
GFR, Estimated: >60 mL/min
Glucose, Bld: 108 mg/dL — ABNORMAL HIGH (ref 70–99)
Potassium: 3.6 mmol/L (ref 3.5–5.1)
Sodium: 138 mmol/L (ref 135–145)
Total Bilirubin: 0.4 mg/dL (ref 0.0–1.2)
Total Protein: 7.8 g/dL (ref 6.5–8.1)

## 2024-04-20 LAB — URINALYSIS, ROUTINE W REFLEX MICROSCOPIC
Bilirubin Urine: NEGATIVE
Glucose, UA: NEGATIVE mg/dL
Hgb urine dipstick: NEGATIVE
Ketones, ur: 5 mg/dL — AB
Nitrite: NEGATIVE
Protein, ur: NEGATIVE mg/dL
Specific Gravity, Urine: 1.009 (ref 1.005–1.030)
pH: 5 (ref 5.0–8.0)

## 2024-04-20 LAB — CBC
HCT: 43.8 % (ref 36.0–46.0)
Hemoglobin: 14.4 g/dL (ref 12.0–15.0)
MCH: 27.7 pg (ref 26.0–34.0)
MCHC: 32.9 g/dL (ref 30.0–36.0)
MCV: 84.2 fL (ref 80.0–100.0)
Platelets: 260 K/uL (ref 150–400)
RBC: 5.2 MIL/uL — ABNORMAL HIGH (ref 3.87–5.11)
RDW: 12.8 % (ref 11.5–15.5)
WBC: 8 K/uL (ref 4.0–10.5)
nRBC: 0 % (ref 0.0–0.2)

## 2024-04-20 LAB — LIPASE, BLOOD: Lipase: 22 U/L (ref 11–51)

## 2024-04-20 LAB — RESP PANEL BY RT-PCR (RSV, FLU A&B, COVID)  RVPGX2
Influenza A by PCR: NEGATIVE
Influenza B by PCR: NEGATIVE
Resp Syncytial Virus by PCR: NEGATIVE
SARS Coronavirus 2 by RT PCR: NEGATIVE

## 2024-04-20 LAB — POC URINE PREG, ED: Preg Test, Ur: NEGATIVE

## 2024-04-20 MED ORDER — ONDANSETRON HCL 4 MG PO TABS: 4.0000 mg | ORAL_TABLET | Freq: Four times a day (QID) | ORAL | Status: DC | PRN

## 2024-04-20 MED ORDER — SODIUM CHLORIDE 0.9 % IV BOLUS
1000.0000 mL | Freq: Once | INTRAVENOUS | Status: AC
  Administered 2024-04-20: 1000 mL via INTRAVENOUS

## 2024-04-20 MED ORDER — ACETAMINOPHEN 325 MG PO TABS: 650.0000 mg | ORAL_TABLET | ORAL | Status: DC | PRN

## 2024-04-20 MED ORDER — ONDANSETRON HCL 4 MG/2ML IJ SOLN
4.0000 mg | INTRAMUSCULAR | Status: AC
  Administered 2024-04-20: 4 mg via INTRAVENOUS
  Filled 2024-04-20: qty 2

## 2024-04-20 MED ORDER — LORAZEPAM 2 MG/ML IJ SOLN: 1.0000 mg | INTRAMUSCULAR | Status: DC | PRN

## 2024-04-20 MED ORDER — HYDROCODONE-ACETAMINOPHEN 5-325 MG PO TABS
1.0000 | ORAL_TABLET | ORAL | Status: DC | PRN
  Administered 2024-04-21: 1 via ORAL
  Filled 2024-04-20: qty 1

## 2024-04-20 MED ORDER — LAMOTRIGINE 25 MG PO TABS
150.0000 mg | ORAL_TABLET | ORAL | Status: AC
  Administered 2024-04-20: 150 mg via ORAL
  Filled 2024-04-20: qty 2

## 2024-04-20 MED ORDER — MORPHINE SULFATE (PF) 4 MG/ML IV SOLN
4.0000 mg | Freq: Once | INTRAVENOUS | Status: AC
  Administered 2024-04-20: 4 mg via INTRAVENOUS
  Filled 2024-04-20: qty 1

## 2024-04-20 MED ORDER — KETOROLAC TROMETHAMINE 30 MG/ML IJ SOLN
15.0000 mg | Freq: Once | INTRAMUSCULAR | Status: AC
  Administered 2024-04-20: 15 mg via INTRAVENOUS
  Filled 2024-04-20: qty 1

## 2024-04-20 MED ORDER — MORPHINE SULFATE (PF) 2 MG/ML IV SOLN
1.0000 mg | INTRAVENOUS | Status: DC | PRN
  Administered 2024-04-21: 1 mg via INTRAVENOUS
  Filled 2024-04-20: qty 1

## 2024-04-20 MED ORDER — SODIUM CHLORIDE 0.9 % IV SOLN: 12.5000 mg | Freq: Four times a day (QID) | INTRAVENOUS | Status: DC | PRN

## 2024-04-20 MED ORDER — SODIUM CHLORIDE 0.9 % IV SOLN
75.0000 mL/h | INTRAVENOUS | Status: DC
  Administered 2024-04-20: 75 mL/h via INTRAVENOUS

## 2024-04-20 MED ORDER — LAMOTRIGINE 25 MG PO TABS
150.0000 mg | ORAL_TABLET | Freq: Two times a day (BID) | ORAL | Status: DC
  Administered 2024-04-21: 150 mg via ORAL
  Filled 2024-04-20: qty 2

## 2024-04-20 MED ORDER — SODIUM CHLORIDE 0.9 % IV BOLUS
500.0000 mL | Freq: Once | INTRAVENOUS | Status: AC
  Administered 2024-04-20: 500 mL via INTRAVENOUS

## 2024-04-20 MED ORDER — LAMOTRIGINE 25 MG PO TABS
150.0000 mg | ORAL_TABLET | Freq: Once | ORAL | Status: AC
  Administered 2024-04-20: 150 mg via ORAL
  Filled 2024-04-20: qty 2

## 2024-04-20 MED ORDER — METOCLOPRAMIDE HCL 5 MG/ML IJ SOLN
10.0000 mg | Freq: Once | INTRAMUSCULAR | Status: AC
  Administered 2024-04-20: 10 mg via INTRAVENOUS
  Filled 2024-04-20: qty 2

## 2024-04-20 MED ORDER — ORAL CARE MOUTH RINSE: 15.0000 mL | OROMUCOSAL | Status: DC | PRN

## 2024-04-20 MED ORDER — IOHEXOL 300 MG/ML  SOLN
100.0000 mL | Freq: Once | INTRAMUSCULAR | Status: AC | PRN
  Administered 2024-04-20: 100 mL via INTRAVENOUS

## 2024-04-20 MED ORDER — ACETAMINOPHEN 650 MG RE SUPP: 650.0000 mg | RECTAL | Status: DC | PRN

## 2024-04-20 MED ORDER — PROMETHAZINE HCL 25 MG/ML IJ SOLN
12.5000 mg | Freq: Once | INTRAMUSCULAR | Status: AC
  Administered 2024-04-20: 12.5 mg via INTRAMUSCULAR
  Filled 2024-04-20: qty 1

## 2024-04-20 MED ORDER — ORAL CARE MOUTH RINSE
15.0000 mL | OROMUCOSAL | Status: DC
  Filled 2024-04-20 (×16): qty 15

## 2024-04-20 MED ORDER — ONDANSETRON HCL 4 MG/2ML IJ SOLN
4.0000 mg | Freq: Four times a day (QID) | INTRAMUSCULAR | Status: DC | PRN
  Administered 2024-04-20 – 2024-04-21 (×2): 4 mg via INTRAVENOUS
  Filled 2024-04-20 (×2): qty 2

## 2024-04-20 MED ORDER — PANTOPRAZOLE SODIUM 40 MG IV SOLR
40.0000 mg | INTRAVENOUS | Status: DC
  Administered 2024-04-20: 40 mg via INTRAVENOUS
  Filled 2024-04-20: qty 10

## 2024-04-20 NOTE — Assessment & Plan Note (Addendum)
 Single episode of blood emesis, suspect Mallory-Weiss IV hydration, IV Protonix, IV antiemetics Follow-up UDS Clear liquid diet for now to advance as tolerated

## 2024-04-20 NOTE — H&P (Signed)
 " History and Physical    Patient: Emily Lyons FMW:[id number] DOB: 2000/09/16 DOA: 04/20/2024 DOS: the patient was seen and examined on 04/20/2024 PCP: [facility]  Patient coming from: Home  Chief Complaint:  Chief Complaint  Patient presents with   Seizures    HPI: Emily Lyons is a 23 y.o. female with medical history significant for Seizure disorder on Lamictal  being admitted for intractable vomiting and abdominal pain x 1 week and a breakthrough seizure on the day of arrival, hitting her head on her boyfriend's shoe when she fell..  Last seizure was a couple months ago and she was diagnosed with seizure disorder a year ago.  She is compliant with her medication.  Over the past week however she has been having left lower quadrant pain radiating to the epigastric area associated with nausea and vomiting though she has continued to take her medication.  Today her emesis was blood-streaked.  She denies fever or chills or dysuria.  Denies vaginal discharge or bleeding. In the ED, vitals within normal limits and labs mostly unremarkable though did show large leukocytes on UA.  UDS pending.  CT head and maxillofacial were nonacute.  Pelvic ultrasound showed a left ovarian cyst likely a hemorrhagic cyst with recommendation for follow-up ultrasound in 2 months. Patient was treated with an NS bolus and antiemetics and pain meds.  She was also given her home oral Lamictal  dose as she was not actively vomiting, just dry heaving.  Empiric antibiotics started for possible UTI Admission requested     Review of Systems: As mentioned in the history of present illness. All other systems reviewed and are negative.  Past Medical History:  Diagnosis Date   Migraines    Seizures (HCC)    History reviewed. No pertinent surgical history. Social History:  reports that she has never smoked. She has never used smokeless tobacco. She reports current drug use. Drug: Marijuana. She reports  that she does not drink alcohol.  Allergies[1]  History reviewed. No pertinent family history.  Prior to Admission medications  Medication Sig Start Date End Date Taking? Authorizing Provider  etonogestrel (NEXPLANON) 68 MG IMPL implant Inject 1 each into the skin once.   Yes [provider]  lamoTRIgine  (LAMICTAL ) 150 MG tablet Take 150 mg by mouth every 12 (twelve) hours. 08/02/23 08/01/24 Yes [provider]  diazePAM  (VALTOCO  10 MG DOSE) 10 MG/0.1ML LIQD Place 10 mg into the nose once as needed for up to 1 dose. To be administered by family if seizure occurs. Patient not taking: Reported on 04/20/2024 02/12/23   Viviann Pastor, MD  escitalopram  (LEXAPRO ) 5 MG tablet Take 1 tablet (5 mg total) by mouth daily. 02/27/22 03/29/22  Jacquetta Sharlot GRADE, NP  medroxyPROGESTERone  (PROVERA ) 5 MG tablet Take 1 tablet (5 mg total) by mouth daily. Patient not taking: Reported on 09/24/2023 04/29/23 04/28/24  Gasper Devere ORN, PA-C  oxyCODONE  (ROXICODONE ) 5 MG immediate release tablet Take 1 tablet (5 mg total) by mouth every 8 (eight) hours as needed. Patient not taking: Reported on 04/20/2024 09/24/23 09/23/24  Waymond Lorelle Cummins, MD    Physical Exam: Vitals:   04/20/24 1310 04/20/24 1432 04/20/24 1630 04/20/24 1810  BP:  134/73 111/85 123/80  Pulse:  90  88  Resp:  18  18  Temp:    98.7 F (37.1 C)  TempSrc:    Oral  SpO2: 100% 100%  100%  Weight:      Height:  Physical Exam Vitals and nursing note reviewed.  Constitutional:      General: She is not in acute distress. HENT:     Head: Normocephalic and atraumatic.  Cardiovascular:     Rate and Rhythm: Normal rate and regular rhythm.     Heart sounds: Normal heart sounds.  Pulmonary:     Effort: Pulmonary effort is normal.     Breath sounds: Normal breath sounds.  Abdominal:     Palpations: Abdomen is soft.     Tenderness: There is abdominal tenderness in the left lower quadrant.  Neurological:     Mental Status: Mental  status is at baseline.     Labs on Admission: I have personally reviewed following labs and imaging studies  CBC: Recent Labs  Lab 04/20/24 1017  WBC 8.0  HGB 14.4  HCT 43.8  MCV 84.2  PLT 260   Basic Metabolic Panel: Recent Labs  Lab 04/20/24 1017  NA 138  K 3.6  CL 105  CO2 22  GLUCOSE 108*  BUN 8  CREATININE 0.64  CALCIUM 9.3   GFR: Estimated Creatinine Clearance: 101.5 mL/min (by C-G formula based on SCr of 0.64 mg/dL). Liver Function Tests: Recent Labs  Lab 04/20/24 1017  AST 22  ALT 14  ALKPHOS 81  BILITOT 0.4  PROT 7.8  ALBUMIN 4.8   Recent Labs  Lab 04/20/24 1017  LIPASE 22   No results for input(s): AMMONIA in the last 168 hours. Coagulation Profile: No results for input(s): INR, PROTIME in the last 168 hours. Cardiac Enzymes: No results for input(s): CKTOTAL, CKMB, CKMBINDEX, TROPONINI in the last 168 hours. BNP (last 3 results) No results for input(s): PROBNP in the last 8760 hours. HbA1C: No results for input(s): HGBA1C in the last 72 hours. CBG: No results for input(s): GLUCAP in the last 168 hours. Lipid Profile: No results for input(s): CHOL, HDL, LDLCALC, TRIG, CHOLHDL, LDLDIRECT in the last 72 hours. Thyroid  Function Tests: No results for input(s): TSH, T4TOTAL, FREET4, T3FREE, THYROIDAB in the last 72 hours. Anemia Panel: No results for input(s): VITAMINB12, FOLATE, FERRITIN, TIBC, IRON, RETICCTPCT in the last 72 hours. Urine analysis:    Component Value Date/Time   COLORURINE YELLOW (A) 04/20/2024 1017   APPEARANCEUR TURBID (A) 04/20/2024 1017   LABSPEC 1.009 04/20/2024 1017   PHURINE 5.0 04/20/2024 1017   GLUCOSEU NEGATIVE 04/20/2024 1017   HGBUR NEGATIVE 04/20/2024 1017   BILIRUBINUR NEGATIVE 04/20/2024 1017   KETONESUR 5 (A) 04/20/2024 1017   PROTEINUR NEGATIVE 04/20/2024 1017   NITRITE NEGATIVE 04/20/2024 1017   LEUKOCYTESUR LARGE (A) 04/20/2024 1017     Radiological Exams on Admission: US  PELVIC COMPLETE W TRANSVAGINAL AND TORSION R/O Result Date: 04/20/2024 EXAM: US  Pelvis, Complete Transvaginal and Transabdominal without Doppler TECHNIQUE: Transabdominal and transvaginal pelvic duplex ultrasound using B-mode/gray scaled imaging without Doppler spectral analysis and color flow was obtained. COMPARISON: CT abdomen and pelvis 04/20/2024. CLINICAL HISTORY: Abdominal pain. FINDINGS: UTERUS: Uterus measures 8.4 x 4.0 x 4.8 cm. Uterus demonstrates normal myometrial echotexture. ENDOMETRIAL STRIPE: Endometrium measures 2 mm. Endometrial stripe is within normal limits. RIGHT OVARY: Right ovary measures 2.8 x 2.7 x 2.8 cm. Right ovary is within normal limits. There is normal arterial and venous Doppler flow. LEFT OVARY: Left ovary measures 4.3 x 2.5 x 2.9 cm. There is a complex cyst in the left ovary measuring 3.0 x 1.2 x 1.2 cm containing internal debris. There is normal arterial and venous Doppler flow. FREE FLUID: There is a small amount  of free fluid in the pelvis. IMPRESSION: 1. Complex left ovarian cyst measuring 3.0 x 1.2 x 1.2 cm, likely hemorrhagic cyst. This can be followed up with pelvic ultrasound in 2 months or 16-month cycle to confirm resolution. 2. Small amount of free fluid in the pelvis. Electronically signed by: Greig Pique MD 04/20/2024 06:52 PM EST RP Workstation: [id number]   CT Maxillofacial Wo Contrast Result Date: 04/20/2024 CLINICAL DATA:  Status post seizure with subsequent trauma. EXAM: CT MAXILLOFACIAL WITHOUT CONTRAST TECHNIQUE: Multidetector CT imaging of the maxillofacial structures was performed. Multiplanar CT image reconstructions were also generated. RADIATION DOSE REDUCTION: This exam was performed according to the departmental dose-optimization program which includes automated exposure control, adjustment of the mA and/or kV according to patient size and/or use of iterative reconstruction technique. COMPARISON:  None  Available. FINDINGS: Osseous: No fracture or mandibular dislocation. No destructive process. Orbits: Negative. No traumatic or inflammatory finding. Sinuses: Clear. Soft tissues: Negative. Limited intracranial: No significant or unexpected finding. IMPRESSION: No acute facial bone fracture. Electronically Signed   By: Suzen Dials M.D.   On: 04/20/2024 18:13   CT Head Wo Contrast Result Date: 04/20/2024 CLINICAL DATA:  Status post seizure. EXAM: CT HEAD WITHOUT CONTRAST TECHNIQUE: Contiguous axial images were obtained from the base of the skull through the vertex without intravenous contrast. RADIATION DOSE REDUCTION: This exam was performed according to the departmental dose-optimization program which includes automated exposure control, adjustment of the mA and/or kV according to patient size and/or use of iterative reconstruction technique. COMPARISON:  None Available. FINDINGS: Brain: No evidence of acute infarction, hemorrhage, hydrocephalus, extra-axial collection or mass lesion/mass effect. Vascular: No hyperdense vessel or unexpected calcification. Skull: Normal. Negative for fracture or focal lesion. Sinuses/Orbits: No acute finding. Other: None. IMPRESSION: No acute intracranial pathology. Electronically Signed   By: Suzen Dials M.D.   On: 04/20/2024 18:09   CT ABDOMEN PELVIS W CONTRAST Result Date: 04/20/2024 EXAM: CT ABDOMEN AND PELVIS WITH CONTRAST 04/20/2024 03:13:18 PM TECHNIQUE: CT of the abdomen and pelvis was performed with the administration of 100 mL of iohexol  (OMNIPAQUE ) 300 MG/ML solution. Multiplanar reformatted images are provided for review. Automated exposure control, iterative reconstruction, and/or weight-based adjustment of the mA/kV was utilized to reduce the radiation dose to as low as reasonably achievable. COMPARISON: CT chest abdomen and pelvis 02/05/2023. CLINICAL HISTORY: Abdominal pain, acute, nonlocalized. FINDINGS: LOWER CHEST: No acute abnormality. LIVER:  The liver is unremarkable. GALLBLADDER AND BILE DUCTS: Gallbladder is unremarkable. No biliary ductal dilatation. SPLEEN: No acute abnormality. PANCREAS: No acute abnormality. ADRENAL GLANDS: No acute abnormality. KIDNEYS, URETERS AND BLADDER: No stones in the kidneys or ureters. No hydronephrosis. No perinephric or periureteral stranding. Urinary bladder is unremarkable. GI AND BOWEL: Stomach demonstrates no acute abnormality. Appendix appears normal. There is sigmoid colon diverticulosis. There is no bowel obstruction. PERITONEUM AND RETROPERITONEUM: There is a small amount of hyperdense free fluid in the pelvis. No free air. VASCULATURE: Aorta is normal in caliber. LYMPH NODES: No lymphadenopathy. REPRODUCTIVE ORGANS: There is a 3.5 x 2.1 x 2.5 cm left adnexal cystic structure. The uterus and right ovary are within normal limits. BONES AND SOFT TISSUES: No acute osseous abnormality. No focal soft tissue abnormality. IMPRESSION: 1. Small amount of  hemorrhagic fluid in the pelvis. 2. 3.5 x 2.1 x 2.5 cm left adnexal cystic structure likely related to the left ovary. This can be further evaluated with pelvic ultrasound. 3. Sigmoid colon diverticulosis without evidence of diverticulitis. Electronically signed by: Greig Pique MD 04/20/2024  04:16 PM EST RP Workstation: [id number]   Data Reviewed for HPI: Relevant notes from primary care and specialist visits, past discharge summaries as available in EHR, including Care Everywhere. Prior diagnostic testing as pertinent to current admission diagnoses Updated medications and problem lists for reconciliation ED course, including vitals, labs, imaging, treatment and response to treatment Triage notes, nursing and pharmacy notes and ED provider's notes Notable results as noted above in HPI      Assessment and Plan: * Intractable vomiting Single episode of blood emesis, suspect Mallory-Weiss IV hydration, IV Protonix, IV antiemetics Follow-up UDS Clear  liquid diet for now to advance as tolerated  Breakthrough seizure (HCC) Suspect secondary to decreased absorption due to intractable heaving and vomiting Was able to tolerate oral Lamictal  in the ED Follow-up Lamictal  level Ativan  as needed seizures Continue home Lamictal  Seizure precautions  Complex ovarian cyst Pelvic ultrasound revealing likely hemorrhagic cyst left ovary For repeat ultrasound in 2 months per radiology recommendation        DVT prophylaxis: SCD  Consults: none  Advance Care Planning: full code  Family Communication: Boyfriend was at bedside, patient was agreeable to him remaining during visit  Disposition Plan: Back to previous home environment  Severity of Illness: The appropriate patient status for this patient is OBSERVATION. Observation status is judged to be reasonable and necessary in order to provide the required intensity of service to ensure the patient's safety. The patient's presenting symptoms, physical exam findings, and initial radiographic and laboratory data in the context of their medical condition is felt to place them at decreased risk for further clinical deterioration. Furthermore, it is anticipated that the patient will be medically stable for discharge from the hospital within 2 midnights of admission.   Author: Delayne LULLA Solian, MD 04/20/2024 9:22 PM  For on call review [email or web address].     [1] No Known Allergies  "

## 2024-04-20 NOTE — Discharge Instructions (Signed)
 No driving for 6 months minimum, follow-up closely with Dr. Lane (call Monday to make an appointment)

## 2024-04-20 NOTE — ED Triage Notes (Signed)
 Patient c/o lower abdominal pain, denies urinary issues, reports seizure this morning. Has hx of epilepsy. Has missed dose of Lamictal , last dose 2 days ago.

## 2024-04-20 NOTE — Assessment & Plan Note (Deleted)
 Suspect secondary to decreased absorption due to intractable heaving and vomiting Was able to tolerate oral Lamictal  in the ED Ativan  as needed seizures Continue home Lamictal  Antiemetics

## 2024-04-20 NOTE — ED Notes (Addendum)
 Contacted lab regarding urine drug screen but they don't have urine left. Will collect and send.

## 2024-04-20 NOTE — ED Provider Notes (Signed)
 "  [facility] Provider Note    Event Date/Time   First MD Initiated Contact with Patient 04/20/24 1307     (approximate)   History   Seizures  Patient speaks fluent English.  Advises and English language preference.  HPI  Emily Lyons is a 23 y.o. female   history of anxiety and reported history of seizures  Patient reports that this morning she had a very brief shaking seizure.  Her boyfriend witnessed it and she was standing and when it happened she fell and struck her jaw at the side her face on his boot.  She came to after that and came to the ER for evaluation.  She reports that she takes Lamictal  for seizures.  She follows with neurology.  She has had many abscence type seizures in the past but often does not have more general type seizures like she had today.  She reports compliance with her medication and has not had otherwise seizure in some time but she does not drive either.  Along with this she has noticed for the last 2 days pain in her abdomen.  She reports it felt like a discomfort with a sudden discomfort in the mid to upper left abdomen.  Associated with occasional nausea and pain.  No vaginal bleeding or discharge.  Patient reports a clicking type discomfort in her jaw but able to open and close it well.    Denies pregnancy.  No fevers or chills.  Had few episodes of vomiting earlier in the day and still having some mild nausea also had some occasional loose  Physical Exam   Triage Vital Signs: ED Triage Vitals  Encounter Vitals Group     BP 04/20/24 1014 126/84     Girls Systolic BP Percentile --      Girls Diastolic BP Percentile --      Boys Systolic BP Percentile --      Boys Diastolic BP Percentile --      Pulse Rate 04/20/24 1014 88     Resp 04/20/24 1014 17     Temp 04/20/24 1014 98 F (36.7 C)     Temp src --      SpO2 04/20/24 1014 100 %     Weight 04/20/24 1015 145 lb (65.8 kg)     Height 04/20/24 1015 5' 3  (1.6 m)     Head Circumference --      Peak Flow --      Pain Score 04/20/24 1015 8     Pain Loc --      Pain Education --      Exclude from Growth Chart --     Most recent vital signs: Vitals:   04/20/24 1630 04/20/24 1810  BP: 111/85 123/80  Pulse:  88  Resp:  18  Temp:  98.7 F (37.1 C)  SpO2:  100%     General: Awake, no distress.  She does appear nauseated though with emesis bag on the bed.  Not in acute extremis however.  She is very pleasant CV:  Good peripheral perfusion.  Normal tone Resp:  Normal effort.  Clear bilateral Abd:  No distention.  Reports tenderness to palpation of the left flank left upper quadrant without rebound or guarding.  Mild discomfort to left lower quadrant.  No right sided abdominal pain negative Murphy no pain McBurney's point Other:  Warm well-perfused lower extremities.  Alert oriented in no extremis or distress except she does appear nauseated  ED Results / Procedures / Treatments   Labs (all labs ordered are listed, but only abnormal results are displayed) Labs Reviewed  COMPREHENSIVE METABOLIC PANEL WITH GFR - Abnormal; Notable for the following components:      Result Value   Glucose, Bld 108 (*)    All other components within normal limits  CBC - Abnormal; Notable for the following components:   RBC 5.20 (*)    All other components within normal limits  URINALYSIS, ROUTINE W REFLEX MICROSCOPIC - Abnormal; Notable for the following components:   Color, Urine YELLOW (*)    APPearance TURBID (*)    Ketones, ur 5 (*)    Leukocytes,Ua LARGE (*)    Bacteria, UA RARE (*)    All other components within normal limits  RESP PANEL BY RT-PCR (RSV, FLU A&B, COVID)  [id number]  URINE CULTURE  LIPASE, BLOOD  URINE DRUG SCREEN  POC URINE PREG, ED   Normal comp showed metabolic panel normal CBC normal white count and afebrile argue against infection  Urinalysis sent for culture she is not having discrete dysuria or urinary symptoms no  vaginal pain or discomfort.  She does have large leukocytes and rare bacteria but also notable squamous epithelial cells.  May be indicative of UTI but will send for culture especially with lack of symptomatology  RADIOLOGY  US  PELVIC COMPLETE W TRANSVAGINAL AND TORSION R/O Result Date: 04/20/2024 EXAM: US  Pelvis, Complete Transvaginal and Transabdominal without Doppler TECHNIQUE: Transabdominal and transvaginal pelvic duplex ultrasound using B-mode/gray scaled imaging without Doppler spectral analysis and color flow was obtained. COMPARISON: CT abdomen and pelvis 04/20/2024. CLINICAL HISTORY: Abdominal pain. FINDINGS: UTERUS: Uterus measures 8.4 x 4.0 x 4.8 cm. Uterus demonstrates normal myometrial echotexture. ENDOMETRIAL STRIPE: Endometrium measures 2 mm. Endometrial stripe is within normal limits. RIGHT OVARY: Right ovary measures 2.8 x 2.7 x 2.8 cm. Right ovary is within normal limits. There is normal arterial and venous Doppler flow. LEFT OVARY: Left ovary measures 4.3 x 2.5 x 2.9 cm. There is a complex cyst in the left ovary measuring 3.0 x 1.2 x 1.2 cm containing internal debris. There is normal arterial and venous Doppler flow. FREE FLUID: There is a small amount of free fluid in the pelvis. IMPRESSION: 1. Complex left ovarian cyst measuring 3.0 x 1.2 x 1.2 cm, likely hemorrhagic cyst. This can be followed up with pelvic ultrasound in 2 months or 12-month cycle to confirm resolution. 2. Small amount of free fluid in the pelvis. Electronically signed by: Greig Pique MD 04/20/2024 06:52 PM EST RP Workstation: [id number]   CT Maxillofacial Wo Contrast Result Date: 04/20/2024 CLINICAL DATA:  Status post seizure with subsequent trauma. EXAM: CT MAXILLOFACIAL WITHOUT CONTRAST TECHNIQUE: Multidetector CT imaging of the maxillofacial structures was performed. Multiplanar CT image reconstructions were also generated. RADIATION DOSE REDUCTION: This exam was performed according to the departmental  dose-optimization program which includes automated exposure control, adjustment of the mA and/or kV according to patient size and/or use of iterative reconstruction technique. COMPARISON:  None Available. FINDINGS: Osseous: No fracture or mandibular dislocation. No destructive process. Orbits: Negative. No traumatic or inflammatory finding. Sinuses: Clear. Soft tissues: Negative. Limited intracranial: No significant or unexpected finding. IMPRESSION: No acute facial bone fracture. Electronically Signed   By: Suzen Dials M.D.   On: 04/20/2024 18:13   CT Head Wo Contrast Result Date: 04/20/2024 CLINICAL DATA:  Status post seizure. EXAM: CT HEAD WITHOUT CONTRAST TECHNIQUE: Contiguous axial images were obtained from the base of the skull through  the vertex without intravenous contrast. RADIATION DOSE REDUCTION: This exam was performed according to the departmental dose-optimization program which includes automated exposure control, adjustment of the mA and/or kV according to patient size and/or use of iterative reconstruction technique. COMPARISON:  None Available. FINDINGS: Brain: No evidence of acute infarction, hemorrhage, hydrocephalus, extra-axial collection or mass lesion/mass effect. Vascular: No hyperdense vessel or unexpected calcification. Skull: Normal. Negative for fracture or focal lesion. Sinuses/Orbits: No acute finding. Other: None. IMPRESSION: No acute intracranial pathology. Electronically Signed   By: Suzen Dials M.D.   On: 04/20/2024 18:09   CT ABDOMEN PELVIS W CONTRAST Result Date: 04/20/2024 EXAM: CT ABDOMEN AND PELVIS WITH CONTRAST 04/20/2024 03:13:18 PM TECHNIQUE: CT of the abdomen and pelvis was performed with the administration of 100 mL of iohexol  (OMNIPAQUE ) 300 MG/ML solution. Multiplanar reformatted images are provided for review. Automated exposure control, iterative reconstruction, and/or weight-based adjustment of the mA/kV was utilized to reduce the radiation dose to  as low as reasonably achievable. COMPARISON: CT chest abdomen and pelvis 02/05/2023. CLINICAL HISTORY: Abdominal pain, acute, nonlocalized. FINDINGS: LOWER CHEST: No acute abnormality. LIVER: The liver is unremarkable. GALLBLADDER AND BILE DUCTS: Gallbladder is unremarkable. No biliary ductal dilatation. SPLEEN: No acute abnormality. PANCREAS: No acute abnormality. ADRENAL GLANDS: No acute abnormality. KIDNEYS, URETERS AND BLADDER: No stones in the kidneys or ureters. No hydronephrosis. No perinephric or periureteral stranding. Urinary bladder is unremarkable. GI AND BOWEL: Stomach demonstrates no acute abnormality. Appendix appears normal. There is sigmoid colon diverticulosis. There is no bowel obstruction. PERITONEUM AND RETROPERITONEUM: There is a small amount of hyperdense free fluid in the pelvis. No free air. VASCULATURE: Aorta is normal in caliber. LYMPH NODES: No lymphadenopathy. REPRODUCTIVE ORGANS: There is a 3.5 x 2.1 x 2.5 cm left adnexal cystic structure. The uterus and right ovary are within normal limits. BONES AND SOFT TISSUES: No acute osseous abnormality. No focal soft tissue abnormality. IMPRESSION: 1. Small amount of  hemorrhagic fluid in the pelvis. 2. 3.5 x 2.1 x 2.5 cm left adnexal cystic structure likely related to the left ovary. This can be further evaluated with pelvic ultrasound. 3. Sigmoid colon diverticulosis without evidence of diverticulitis. Electronically signed by: Greig Pique MD 04/20/2024 04:16 PM EST RP Workstation: [id number]      PROCEDURES:  Critical Care performed: No  Procedures   MEDICATIONS ORDERED IN ED: Medications  promethazine  (PHENERGAN ) injection 12.5 mg (has no administration in time range)  sodium chloride  0.9 % bolus 500 mL (has no administration in time range)  morphine  (PF) 4 MG/ML injection 4 mg (has no administration in time range)  lamoTRIgine  (LAMICTAL ) tablet 150 mg (150 mg Oral Given 04/20/24 1021)  sodium chloride  0.9 % bolus 1,000 mL  (0 mLs Intravenous Stopped 04/20/24 1809)  ondansetron  (ZOFRAN ) injection 4 mg (4 mg Intravenous Given 04/20/24 1455)  morphine  (PF) 4 MG/ML injection 4 mg (4 mg Intravenous Given 04/20/24 1454)  iohexol  (OMNIPAQUE ) 300 MG/ML solution 100 mL (100 mLs Intravenous Contrast Given 04/20/24 1504)  ondansetron  (ZOFRAN ) injection 4 mg (4 mg Intravenous Given 04/20/24 1719)  ketorolac  (TORADOL ) 30 MG/ML injection 15 mg (15 mg Intravenous Given 04/20/24 1719)  lamoTRIgine  (LAMICTAL ) tablet 150 mg (150 mg Oral Given by Other 04/20/24 1808)  metoCLOPramide  (REGLAN ) injection 10 mg (10 mg Intravenous Given 04/20/24 1909)     IMPRESSION / MDM / ASSESSMENT AND PLAN / ED COURSE  I reviewed the triage vital signs and the nursing notes.  Differential diagnosis includes, but is not limited to, broad differential but she reports 2 things that I am not certain if that you are actually connected she reports having a seizure disorder with a history of known seizure.  She is awake alert no further seizures she has been able to tolerate taking her Lamictal  afterwards this morning and took a dose in the ER.  Established history of same does not appear that she has had a significant ramp-up in her activity and she follows with neurology.  Her CT imaging is reassuring which was performed as she reports head strike and jaw discomfort though she is able to open and close the jaw but now reports a slight clicking.  Suspect possible TMJ type injury, recommended outpatient follow-up for this  Additionally she has had intractable nausea vomiting diarrhea lower abdominal pain.  Given her history and imaging findings ultrasound reassuring with no evidence of torsion but I do suspect she is having symptomatic symptoms apparently from likely hemorrhagic cyst.  No infectious symptomatology.  Patient's presentation is most consistent with acute complicated illness / injury requiring diagnostic  workup.      Clinical Course as of 04/20/24 1958  Fri Apr 20, 2024  1913 Patient's pain improved she reports pain improved but still having significant nausea occasional dry heaves and very slight nonbloody, slightly acidic appearing emesis. [MQ]  1914 Appears to have intractable nausea at this point.  She does have a history of daily marijuana type use, but does not have a history of obvious cyclical findings.  Suspicion for cyclical vomiting is elevated.  Will trial Reglan .  If continuing to be unable to by mouth, will likely require admission.  If significant improvement post Reglan  and able to take by mouth plan to discharge home as her workup reassuring pain now well-controlled findings of hemorrhagic cyst suspicious in history of established seizure disorder.  She has been able to keep her Lamictal  down which is somewhat reassuring but unable to keep food or large fluid down [MQ]    Clinical Course User Index [MQ] Dicky Anes, MD   Patient admitted due to intractable nausea to the care of Dr. Cleatus.  Patient agreeable with plan ambulatory but still quite nauseated unable to take by mouth without significant nausea.  FINAL CLINICAL IMPRESSION(S) / ED DIAGNOSES   Final diagnoses:  Seizure (HCC)  Hemorrhagic cyst of left ovary  Intractable nausea and vomiting  Tetrahydrocannabinol (THC) use disorder, mild, abuse     Rx / DC Orders   ED Discharge Orders     None        Note:  This document was prepared using Dragon voice recognition software and may include unintentional dictation errors.   Dicky Anes, MD 04/20/24 2114  "

## 2024-04-20 NOTE — Assessment & Plan Note (Addendum)
 Suspect secondary to decreased absorption due to intractable heaving and vomiting Was able to tolerate oral Lamictal  in the ED Follow-up Lamictal  level Ativan  as needed seizures Continue home Lamictal  Seizure precautions

## 2024-04-20 NOTE — Assessment & Plan Note (Signed)
 Pelvic ultrasound revealing likely hemorrhagic cyst left ovary For repeat ultrasound in 2 months per radiology recommendation

## 2024-04-21 DIAGNOSIS — Z1152 Encounter for screening for COVID-19: Secondary | ICD-10-CM | POA: Diagnosis not present

## 2024-04-21 DIAGNOSIS — G43909 Migraine, unspecified, not intractable, without status migrainosus: Secondary | ICD-10-CM | POA: Diagnosis present

## 2024-04-21 DIAGNOSIS — R112 Nausea with vomiting, unspecified: Secondary | ICD-10-CM | POA: Diagnosis present

## 2024-04-21 DIAGNOSIS — Z79899 Other long term (current) drug therapy: Secondary | ICD-10-CM | POA: Diagnosis not present

## 2024-04-21 DIAGNOSIS — N83292 Other ovarian cyst, left side: Secondary | ICD-10-CM | POA: Diagnosis present

## 2024-04-21 DIAGNOSIS — K226 Gastro-esophageal laceration-hemorrhage syndrome: Secondary | ICD-10-CM | POA: Diagnosis present

## 2024-04-21 DIAGNOSIS — G40909 Epilepsy, unspecified, not intractable, without status epilepticus: Secondary | ICD-10-CM | POA: Diagnosis present

## 2024-04-21 DIAGNOSIS — W1830XA Fall on same level, unspecified, initial encounter: Secondary | ICD-10-CM | POA: Diagnosis present

## 2024-04-21 DIAGNOSIS — Z793 Long term (current) use of hormonal contraceptives: Secondary | ICD-10-CM | POA: Diagnosis not present

## 2024-04-21 DIAGNOSIS — F1210 Cannabis abuse, uncomplicated: Secondary | ICD-10-CM | POA: Diagnosis present

## 2024-04-21 DIAGNOSIS — F419 Anxiety disorder, unspecified: Secondary | ICD-10-CM | POA: Diagnosis present

## 2024-04-21 NOTE — ED Notes (Addendum)
 Made provider Niels Hurst, DO, aware that patient wanted to be discharged. Provider let this nurse know that if patient left it would be an ama. This was relayed to the patient and she is [place] with this and has signed ama form. Pt understand that if she leaves there could be adverse events take place including death. Pt understands. Mom is here to take patient home. Pt already had a family member pick her medications up and she will follow up with neurology.

## 2024-04-21 NOTE — Progress Notes (Signed)
" ° ° °  Against Medical Advice   [facility] expresses desire to leave the Hospital immediately. Patient has been warned that this is not medically advisable at this time, and can result in medical complications like Death and Disability. Patient understands and accepts the risks involved and assumes full responsibilty of this decision.  This patient has also been advised that if they feel the need for further medical assistance to return to any available ER or dial [phone].  Informed by Nursing staff that this patient has left care and has signed the form  Against Medical Advice on 04/21/2024.  "

## 2024-04-21 NOTE — Progress Notes (Signed)
 " Progress Note   Patient: Emily Lyons FMW:[id number] DOB: 12/16/2000 DOA: 04/20/2024     0 DOS: the patient was seen and examined on 04/21/2024   Brief hospital course: From HPI Emily Lyons is a 23 y.o. female with medical history significant for Seizure disorder on Lamictal  being admitted for intractable vomiting and abdominal pain x 1 week and a breakthrough seizure on the day of arrival, hitting her head on her boyfriend's shoe when she fell..  Last seizure was a couple months ago and she was diagnosed with seizure disorder a year ago.  She is compliant with her medication.  Over the past week however she has been having left lower quadrant pain radiating to the epigastric area associated with nausea and vomiting though she has continued to take her medication.  Today her emesis was blood-streaked.  She denies fever or chills or dysuria.  Denies vaginal discharge or bleeding. In the ED, vitals within normal limits and labs mostly unremarkable though did show large leukocytes on UA.  UDS pending.  CT head and maxillofacial were nonacute.  Pelvic ultrasound showed a left ovarian cyst likely a hemorrhagic cyst with recommendation for follow-up ultrasound in 2 months. Patient was treated with an NS bolus and antiemetics and pain meds.  She was also given her home oral Lamictal  dose as she was not actively vomiting, just dry heaving.  Empiric antibiotics started for possible UTI Admission requested     Assessment and Plan:    Intractable vomiting Single episode of blood emesis, suspect Mallory-Weiss Advance diet as tolerated Continue IV fluids   Breakthrough seizure (HCC) Suspect secondary to decreased absorption due to intractable heaving and vomiting Was able to tolerate oral Lamictal  in the ED Follow-up Lamictal  level Ativan  as needed seizures Continue home Lamictal  Seizure precautions   Complex ovarian cyst Pelvic ultrasound revealing likely hemorrhagic cyst left  ovary For repeat ultrasound in 2 months per radiology recommendation   DVT prophylaxis: SCD   Consults: none   Advance Care Planning: full code   Family Communication: Boyfriend was at bedside, patient was agreeable to him remaining during visit   Disposition Plan: Back to previous home environment    Subjective:  Patient seen and examined at bedside this morning Still having vomiting as well as nausea No seizures so far Will continue IV fluid until patient able to tolerate meal  Physical Exam:  Vitals and nursing note reviewed.  Constitutional:      General: She is not in acute distress. HENT:     Head: Normocephalic and atraumatic.  Cardiovascular:     Rate and Rhythm: Normal rate and regular rhythm.     Heart sounds: Normal heart sounds.  Pulmonary:     Effort: Pulmonary effort is normal.     Breath sounds: Normal breath sounds.  Abdominal:     Palpations: Abdomen is soft.     Tenderness: There is abdominal tenderness in the left lower quadrant.  Neurological:     Mental Status: Mental status is at baseline.    Vitals:   04/21/24 0645 04/21/24 0700 04/21/24 0730 04/21/24 1126  BP:   111/61 116/70  Pulse: 72 66 66 68  Resp:   18 20  Temp:   99 F (37.2 C) 98.7 F (37.1 C)  TempSrc:   Oral Oral  SpO2: 100% 100% 100% 100%  Weight:      Height:        Data Reviewed: Abdominal CT scan reviewed that showed ovarian  cyst but no other acute abnormality    Latest Ref Rng & Units 04/20/2024   10:17 AM 09/24/2023    7:51 PM 05/04/2023    6:17 PM  CBC  WBC 4.0 - 10.5 K/uL 8.0  12.6  11.6   Hemoglobin 12.0 - 15.0 g/dL 85.5  86.2  86.6   Hematocrit 36.0 - 46.0 % 43.8  40.0  39.7   Platelets 150 - 400 K/uL 260  285  272        Latest Ref Rng & Units 04/20/2024   10:17 AM 09/24/2023    7:51 PM 05/04/2023    6:17 PM  BMP  Glucose 70 - 99 mg/dL 891  878  97   BUN 6 - 20 mg/dL 8  9  7    Creatinine 0.44 - 1.00 mg/dL 9.35  9.39  9.41   Sodium 135 - 145 mmol/L 138   136  136   Potassium 3.5 - 5.1 mmol/L 3.6  3.6  3.7   Chloride 98 - 111 mmol/L 105  106  105   CO2 22 - 32 mmol/L 22  21  22    Calcium 8.9 - 10.3 mg/dL 9.3  9.3  9.1        Author: Drue ONEIDA Potter, MD 04/21/2024 4:32 PM  For on call review [email or web address].  "

## 2024-04-22 LAB — URINE CULTURE: Culture: >=100000 — AB
